# Patient Record
Sex: Male | Born: 1950 | Race: Asian | Hispanic: No | Marital: Married | Smoking: Never smoker
Health system: Southern US, Community
[De-identification: ages and names within clinical notes are randomized; demographics above are authoritative.]

## PROBLEM LIST (undated history)

## (undated) DIAGNOSIS — I10 Essential (primary) hypertension: Secondary | ICD-10-CM

## (undated) DIAGNOSIS — F32A Depression, unspecified: Secondary | ICD-10-CM

## (undated) DIAGNOSIS — F329 Major depressive disorder, single episode, unspecified: Secondary | ICD-10-CM

## (undated) DIAGNOSIS — E785 Hyperlipidemia, unspecified: Secondary | ICD-10-CM

## (undated) HISTORY — DX: Depression, unspecified: F32.A

## (undated) HISTORY — DX: Hyperlipidemia, unspecified: E78.5

## (undated) HISTORY — PX: HERNIA REPAIR: SHX51

## (undated) HISTORY — DX: Major depressive disorder, single episode, unspecified: F32.9

## (undated) HISTORY — DX: Essential (primary) hypertension: I10

---

## 1990-06-23 HISTORY — PX: HERNIA REPAIR: SHX51

## 2011-10-07 ENCOUNTER — Other Ambulatory Visit: Payer: Self-pay | Admitting: Internal Medicine

## 2011-11-22 ENCOUNTER — Ambulatory Visit (INDEPENDENT_AMBULATORY_CARE_PROVIDER_SITE_OTHER): Payer: BC Managed Care – PPO | Admitting: Emergency Medicine

## 2011-11-22 VITALS — BP 130/75 | HR 61 | Temp 98.0°F | Resp 16 | Ht 65.0 in | Wt 130.0 lb

## 2011-11-22 DIAGNOSIS — E785 Hyperlipidemia, unspecified: Secondary | ICD-10-CM

## 2011-11-22 DIAGNOSIS — I10 Essential (primary) hypertension: Secondary | ICD-10-CM

## 2011-11-22 LAB — POCT CBC
Granulocyte percent: 57 %G (ref 37–80)
MID (cbc): 0.3 (ref 0–0.9)
MPV: 9.4 fL (ref 0–99.8)
POC Granulocyte: 3.1 (ref 2–6.9)
POC MID %: 5.4 %M (ref 0–12)
Platelet Count, POC: 263 10*3/uL (ref 142–424)
RBC: 4.93 M/uL (ref 4.69–6.13)

## 2011-11-22 LAB — COMPREHENSIVE METABOLIC PANEL
ALT: 18 U/L (ref 0–53)
AST: 22 U/L (ref 0–37)
Albumin: 4.6 g/dL (ref 3.5–5.2)
Calcium: 9.4 mg/dL (ref 8.4–10.5)
Chloride: 102 mEq/L (ref 96–112)
Potassium: 4.9 mEq/L (ref 3.5–5.3)

## 2011-11-22 LAB — LIPID PANEL
HDL: 44 mg/dL (ref >39)
LDL Cholesterol: 98 mg/dL (ref 0–99)
VLDL: 29 mg/dL (ref 0–40)

## 2011-11-22 MED ORDER — PRAVASTATIN SODIUM 40 MG PO TABS: 40.0000 mg | ORAL_TABLET | Freq: Every day | ORAL | Status: DC

## 2011-11-22 MED ORDER — AMLODIPINE BESYLATE 5 MG PO TABS: 5.0000 mg | ORAL_TABLET | Freq: Every day | ORAL | Status: DC

## 2011-11-22 NOTE — Progress Notes (Signed)
  Subjective:    Patient ID: Patrick Abbott, male    DOB: 05/12/1951, 61 y.o.   MRN: [id number]  HPI patient enters for followup of his high cholesterol and high triglycerides. He has a history of hepatitis B. and has recovered with positive core antibody. He has a history of hemoglobin E. he states he's been feeling well. He does not smoke he occasionally drinks some beer he overall is in good health. He also currently is on pressure medications.    Review of Systems he denies chest pain shortness of breath GI problems.     Objective:   Physical Exam  Constitutional: He appears well-developed and well-nourished.  HENT:  Right Ear: External ear normal.  Left Ear: External ear normal.  Neck: No thyromegaly present.  Cardiovascular: Normal rate and regular rhythm.   Pulmonary/Chest: Effort normal and breath sounds normal.  Abdominal: Soft. He exhibits no distension. There is no tenderness. There is no rebound.    Results for orders placed in visit on 11/22/11  POCT CBC      Component Value Range   WBC 5.5  4.6 - 10.2 (K/uL)   Lymph, poc 2.1  0.6 - 3.4    POC LYMPH PERCENT 37.6  10 - 50 (%L)   MID (cbc) 0.3  0 - 0.9    POC MID % 5.4  0 - 12 (%M)   POC Granulocyte 3.1  2 - 6.9    Granulocyte percent 57.0  37 - 80 (%G)   RBC 4.93  4.69 - 6.13 (M/uL)   Hemoglobin 13.1 (*) 14.1 - 18.1 (g/dL)   HCT, POC 40.9 (*) 81.1 - 53.7 (%)   MCV 80.1  80 - 97 (fL)   MCH, POC 26.6 (*) 27 - 31.2 (pg)   MCHC 33.2  31.8 - 35.4 (g/dL)   RDW, POC 91.4     Platelet Count, POC 263  142 - 424 (K/uL)   MPV 9.4  0 - 99.8 (fL)        Assessment & Plan:  Patient in for followup of high cholesterol and his medications. Go ahead and check labs.

## 2012-11-29 ENCOUNTER — Other Ambulatory Visit: Payer: Self-pay | Admitting: Emergency Medicine

## 2012-12-23 ENCOUNTER — Ambulatory Visit (INDEPENDENT_AMBULATORY_CARE_PROVIDER_SITE_OTHER): Payer: BC Managed Care – PPO | Admitting: Family Medicine

## 2012-12-23 ENCOUNTER — Ambulatory Visit: Payer: BC Managed Care – PPO

## 2012-12-23 VITALS — BP 120/80 | HR 60 | Temp 97.7°F | Resp 18 | Ht 65.5 in | Wt 135.6 lb

## 2012-12-23 DIAGNOSIS — Z23 Encounter for immunization: Secondary | ICD-10-CM

## 2012-12-23 DIAGNOSIS — M546 Pain in thoracic spine: Secondary | ICD-10-CM

## 2012-12-23 DIAGNOSIS — E7800 Pure hypercholesterolemia, unspecified: Secondary | ICD-10-CM

## 2012-12-23 DIAGNOSIS — Z0000 Encounter for general adult medical examination without abnormal findings: Secondary | ICD-10-CM

## 2012-12-23 DIAGNOSIS — I10 Essential (primary) hypertension: Secondary | ICD-10-CM

## 2012-12-23 LAB — POCT URINALYSIS DIPSTICK
Leukocytes, UA: NEGATIVE
Nitrite, UA: NEGATIVE
Protein, UA: NEGATIVE
pH, UA: 5.5

## 2012-12-23 MED ORDER — AMLODIPINE BESYLATE 5 MG PO TABS: 5.0000 mg | ORAL_TABLET | Freq: Every day | ORAL | Status: DC

## 2012-12-23 MED ORDER — PRAVASTATIN SODIUM 40 MG PO TABS: 40.0000 mg | ORAL_TABLET | Freq: Every day | ORAL | Status: DC

## 2012-12-23 MED ORDER — MELOXICAM 15 MG PO TABS: 15.0000 mg | ORAL_TABLET | Freq: Every day | ORAL | Status: DC

## 2012-12-23 MED ORDER — CYCLOBENZAPRINE HCL 5 MG PO TABS: 5.0000 mg | ORAL_TABLET | Freq: Every evening | ORAL | Status: DC | PRN
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
## 2012-12-24 LAB — CBC WITH DIFFERENTIAL/PLATELET
Basophils Absolute: 0 10*3/uL (ref 0.0–0.1)
HCT: 38.5 % — ABNORMAL LOW (ref 39.0–52.0)
Lymphocytes Relative: 43 % (ref 12–46)
Monocytes Absolute: 0.4 10*3/uL (ref 0.1–1.0)
Neutro Abs: 3 10*3/uL (ref 1.7–7.7)
Neutrophils Relative %: 47 % (ref 43–77)
RDW: 14.2 % (ref 11.5–15.5)
WBC: 6.2 10*3/uL (ref 4.0–10.5)

## 2012-12-24 LAB — COMPREHENSIVE METABOLIC PANEL
Albumin: 4.7 g/dL (ref 3.5–5.2)
CO2: 28 mEq/L (ref 19–32)
Calcium: 9.1 mg/dL (ref 8.4–10.5)
Glucose, Bld: 82 mg/dL (ref 70–99)
Sodium: 139 mEq/L (ref 135–145)
Total Bilirubin: 0.5 mg/dL (ref 0.3–1.2)
Total Protein: 7.6 g/dL (ref 6.0–8.3)

## 2012-12-24 LAB — LIPID PANEL: HDL: 44 mg/dL (ref >39)

## 2012-12-24 LAB — HEMOGLOBIN A1C
Hgb A1c MFr Bld: 5.1 % (ref <5.7)
Mean Plasma Glucose: 100 mg/dL (ref <117)

## 2012-12-24 LAB — VITAMIN D 25 HYDROXY (VIT D DEFICIENCY, FRACTURES): Vit D, 25-Hydroxy: 23 ng/mL — ABNORMAL LOW (ref 30–89)

## 2012-12-24 LAB — VITAMIN B12: Vitamin B-12: 413 pg/mL (ref 211–911)

## 2012-12-24 LAB — TSH: TSH: 0.706 u[IU]/mL (ref 0.350–4.500)

## 2012-12-25 LAB — PSA: PSA: 0.74 ng/mL (ref <=4.00)

## 2013-07-04 ENCOUNTER — Other Ambulatory Visit: Payer: Self-pay | Admitting: Family Medicine

## 2013-07-29 ENCOUNTER — Ambulatory Visit (INDEPENDENT_AMBULATORY_CARE_PROVIDER_SITE_OTHER): Payer: BC Managed Care – PPO | Admitting: Emergency Medicine

## 2013-07-29 VITALS — BP 130/88 | HR 55 | Temp 98.2°F | Resp 16 | Ht 65.5 in | Wt 136.0 lb

## 2013-07-29 DIAGNOSIS — B029 Zoster without complications: Secondary | ICD-10-CM

## 2013-07-29 MED ORDER — ACETAMINOPHEN-CODEINE #3 300-30 MG PO TABS: 1.0000 | ORAL_TABLET | ORAL | Status: DC | PRN

## 2013-07-29 MED ORDER — PRAVASTATIN SODIUM 40 MG PO TABS: 40.0000 mg | ORAL_TABLET | Freq: Every day | ORAL | Status: DC

## 2013-07-29 MED ORDER — AMLODIPINE BESYLATE 5 MG PO TABS: 5.0000 mg | ORAL_TABLET | Freq: Every day | ORAL | Status: DC

## 2013-07-29 NOTE — Progress Notes (Signed)
[facility] [address], [place] [address] [phone]  Date:  07/29/2013   Name:  Yechezkel Fertig   DOB:  [birth year]   MRN:  [id number]  PCP:  No PCP Per Patient    Chief Complaint: Rash and Medication Refill   History of Present Illness:  Crystal Ellwood is a 63 y.o. very pleasant male patient who presents with the following:  History of varicella.  Now has painful vesicular rash on his left side from the midline around to the lateral lower chest wall.  No fever or chills.  Says started Monday. No improvement with over the counter medications or other home remedies. Denies other complaint or health concern today.   There are no active problems to display for this patient.   Past Medical History  Diagnosis Date  . Hyperlipidemia   . Hypertension     Past Surgical History  Procedure Laterality Date  . Hernia repair      Left    History  Substance Use Topics  . Smoking status: Never Smoker   . Smokeless tobacco: Not on file  . Alcohol Use: No    No family history on file.  No Known Allergies  Medication list has been reviewed and updated.  Current Outpatient Prescriptions on File Prior to Visit  Medication Sig Dispense Refill  . amLODipine (NORVASC) 5 MG tablet Take 1 tablet (5 mg total) by mouth daily. PATIENT NEEDS OFFICE VISIT FOR ADDITIONAL REFILLS  30 tablet  0  . pravastatin (PRAVACHOL) 40 MG tablet Take 1 tablet (40 mg total) by mouth daily. PATIENT NEEDS OFFICE VISIT FOR ADDITIONAL REFILLS  30 tablet  0  . cyclobenzaprine (FLEXERIL) 5 MG tablet Take 1 tablet (5 mg total) by mouth at bedtime as needed for muscle spasms. And back pain  30 tablet  0  . meloxicam (MOBIC) 15 MG tablet Take 1 tablet (15 mg total) by mouth daily. For back pain  30 tablet  0   No current facility-administered medications on file prior to visit.    Review of Systems:  As per HPI, otherwise negative.    Physical Examination: Filed Vitals:   07/29/13 1753   BP: 130/88  Pulse: 55  Temp: 98.2 F (36.8 C)  Resp: 16   Filed Vitals:   07/29/13 1753  Height: 5' 5.5" (1.664 m)  Weight: 136 lb (61.689 kg)   Body mass index is 22.28 kg/(m^2). Ideal Body Weight: Weight in (lb) to have BMI = 25: 152.2   GEN: WDWN, NAD, Non-toxic, Alert & Oriented x 3 HEENT: Atraumatic, Normocephalic.  Ears and Nose: No external deformity. EXTR: No clubbing/cyanosis/edema NEURO: Normal gait.  PSYCH: Normally interactive. Conversant. Not depressed or anxious appearing.  Calm demeanor.  SKIN:  Shingles left chest wall  Assessment and Plan: Shingles Tylenol #3 Too late for valtrex   Signed,  Ellison Carwin, MD

## 2013-07-29 NOTE — Addendum Note (Signed)
Addended by: Roselee Culver on: 07/29/2013 06:53 PM   Modules accepted: Orders

## 2013-07-29 NOTE — Patient Instructions (Signed)
Shingles Shingles (herpes zoster) is an infection that is caused by the same virus that causes chickenpox (varicella). The infection causes a painful skin rash and fluid-filled blisters, which eventually break open, crust over, and heal. It may occur in any area of the body, but it usually affects only one side of the body or face. The pain of shingles usually lasts about 1 month. However, some people with shingles may develop long-term (chronic) pain in the affected area of the body. Shingles often occurs many years after the person had chickenpox. It is more common:  In people older than 50 years.  In people with weakened immune systems, such as those with HIV, AIDS, or cancer.  In people taking medicines that weaken the immune system, such as transplant medicines.  In people under great stress. CAUSES  Shingles is caused by the varicella zoster virus (VZV), which also causes chickenpox. After a person is infected with the virus, it can remain in the person's body for years in an inactive state (dormant). To cause shingles, the virus reactivates and breaks out as an infection in a nerve root. The virus can be spread from person to person (contagious) through contact with open blisters of the shingles rash. It will only spread to people who have not had chickenpox. When these people are exposed to the virus, they may develop chickenpox. They will not develop shingles. Once the blisters scab over, the person is no longer contagious and cannot spread the virus to others. SYMPTOMS  Shingles shows up in stages. The initial symptoms may be pain, itching, and tingling in an area of the skin. This pain is usually described as burning, stabbing, or throbbing.In a few days or weeks, a painful red rash will appear in the area where the pain, itching, and tingling were felt. The rash is usually on one side of the body in a band or belt-like pattern. Then, the rash usually turns into fluid-filled blisters. They  will scab over and dry up in approximately 2 3 weeks. Flu-like symptoms may also occur with the initial symptoms, the rash, or the blisters. These may include:  Fever.  Chills.  Headache.  Upset stomach. DIAGNOSIS  Your caregiver will perform a skin exam to diagnose shingles. Skin scrapings or fluid samples may also be taken from the blisters. This sample will be examined under a microscope or sent to a lab for further testing. TREATMENT  There is no specific cure for shingles. Your caregiver will likely prescribe medicines to help you manage the pain, recover faster, and avoid long-term problems. This may include antiviral drugs, anti-inflammatory drugs, and pain medicines. HOME CARE INSTRUCTIONS   Take a cool bath or apply cool compresses to the area of the rash or blisters as directed. This may help with the pain and itching.   Only take over-the-counter or prescription medicines as directed by your caregiver.   Rest as directed by your caregiver.  Keep your rash and blisters clean with mild soap and cool water or as directed by your caregiver.  Do not pick your blisters or scratch your rash. Apply an anti-itch cream or numbing creams to the affected area as directed by your caregiver.  Keep your shingles rash covered with a loose bandage (dressing).  Avoid skin contact with:  Babies.   Pregnant women.   Children with eczema.   Elderly people with transplants.   People with chronic illnesses, such as leukemia or AIDS.   Wear loose-fitting clothing to help ease   the pain of material rubbing against the rash.  Keep all follow-up appointments with your caregiver.If the area involved is on your face, you may receive a referral for follow-up to a specialist, such as an eye doctor (ophthalmologist) or an ear, nose, and throat (ENT) doctor. Keeping all follow-up appointments will help you avoid eye complications, chronic pain, or disability.  SEEK IMMEDIATE MEDICAL  CARE IF:   You have facial pain, pain around the eye area, or loss of feeling on one side of your face.  You have ear pain or ringing in your ear.  You have loss of taste.  Your pain is not relieved with prescribed medicines.   Your redness or swelling spreads.   You have more pain and swelling.  Your condition is worsening or has changed.   You have a feveror persistent symptoms for more than 2 3 days.  You have a fever and your symptoms suddenly get worse. MAKE SURE YOU:  Understand these instructions.  Will watch your condition.  Will get help right away if you are not doing well or get worse. Document Released: 06/09/2005 Document Revised: 03/03/2012 Document Reviewed: 01/22/2012 ExitCare Patient Information 2014 [employer].  

## 2014-02-18 ENCOUNTER — Ambulatory Visit (INDEPENDENT_AMBULATORY_CARE_PROVIDER_SITE_OTHER): Payer: BC Managed Care – PPO | Admitting: Family Medicine

## 2014-02-18 VITALS — BP 116/72 | HR 61 | Temp 97.7°F | Resp 14 | Ht 64.5 in | Wt 131.2 lb

## 2014-02-18 DIAGNOSIS — G5600 Carpal tunnel syndrome, unspecified upper limb: Secondary | ICD-10-CM | POA: Insufficient documentation

## 2014-02-18 DIAGNOSIS — G5601 Carpal tunnel syndrome, right upper limb: Secondary | ICD-10-CM

## 2014-02-18 MED ORDER — MELOXICAM 15 MG PO TABS: 15.0000 mg | ORAL_TABLET | Freq: Every day | ORAL | Status: DC

## 2014-02-18 NOTE — Patient Instructions (Signed)
Carpal Tunnel Syndrome The carpal tunnel is a narrow area located on the palm side of your wrist. The tunnel is formed by the wrist bones and ligaments. Nerves, blood vessels, and tendons pass through the carpal tunnel. Repeated wrist motion or certain diseases may cause swelling within the tunnel. This swelling pinches the main nerve in the wrist (median nerve) and causes the painful hand and arm condition called carpal tunnel syndrome. CAUSES   Repeated wrist motions.  Wrist injuries.  Certain diseases like arthritis, diabetes, alcoholism, hyperthyroidism, and kidney failure.  Obesity.  Pregnancy. SYMPTOMS   A "pins and needles" feeling in your fingers or hand, especially in your thumb, index and middle fingers.  Tingling or numbness in your fingers or hand.  An aching feeling in your entire arm, especially when your wrist and elbow are bent for long periods of time.  Wrist pain that goes up your arm to your shoulder.  Pain that goes down into your palm or fingers.  A weak feeling in your hands. DIAGNOSIS  Your health care provider will take your history and perform a physical exam. An electromyography test may be needed. This test measures electrical signals sent out by your nerves into the muscles. The electrical signals are usually slowed by carpal tunnel syndrome. You may also need X-rays. TREATMENT  Carpal tunnel syndrome may clear up by itself. Your health care provider may recommend a wrist splint or medicine such as a nonsteroidal anti-inflammatory medicine. Cortisone injections may help. Sometimes, surgery may be needed to free the pinched nerve.  HOME CARE INSTRUCTIONS   Take all medicine as directed by your health care provider. Only take over-the-counter or prescription medicines for pain, discomfort, or fever as directed by your health care provider.  If you were given a splint to keep your wrist from bending, wear it as directed. It is important to wear the splint at  night. Wear the splint for as long as you have pain or numbness in your hand, arm, or wrist. This may take 1 to 2 months.  Rest your wrist from any activity that may be causing your pain. If your symptoms are work-related, you may need to talk to your employer about changing to a job that does not require using your wrist.  Put ice on your wrist after long periods of wrist activity.  Put ice in a plastic bag.  Place a towel between your skin and the bag.  Leave the ice on for 15-20 minutes, 03-04 times a day.  Keep all follow-up visits as directed by your health care provider. This includes any orthopedic referrals, physical therapy, and rehabilitation. Any delay in getting necessary care could result in a delay or failure of your condition to heal. SEEK IMMEDIATE MEDICAL CARE IF:   You have new, unexplained symptoms.  Your symptoms get worse and are not helped or controlled with medicines. MAKE SURE YOU:   Understand these instructions.  Will watch your condition.  Will get help right away if you are not doing well or get worse. Document Released: 06/06/2000 Document Revised: 10/24/2013 Document Reviewed: 04/25/2011 ExitCare Patient Information 2015 [employer]. This information is not intended to replace advice given to you by your health care provider. Make sure you discuss any questions you have with your health care provider.  

## 2014-02-18 NOTE — Progress Notes (Signed)
° °  Subjective:    Patient ID: Patrick Abbott, male    DOB: Sep 06, 1950, 63 y.o.   MRN: [id number]  HPI Chief Complaint  Patient presents with   Hand Pain     right arm numbness up to arm, mainly in fingers   Follow-up    previously had shingles inquiring about the shot    This chart was scribed for Patrick Abbott , MD by Thea Alken, ED Scribe. This patient was seen in room 5 and the patient's care was started at 11:10 AM.  HPI Comments: Fields Oros is a 63 y.o. male who [facility] complaining of right wrist and hand numbness. Pt reports numbness to finger, hands and entire right arm. 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 He reports pain is relieved with relaxation.  Pt reports denies pain with movement. Pt denies pain with movement.    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  He does the floors and is physically active  Right handed.  Past Medical History  Diagnosis Date   Hyperlipidemia    Hypertension    Past Surgical History  Procedure Laterality Date   Hernia repair      Left   Prior to Admission medications   Medication Sig Start Date End Date Taking? Authorizing Provider  acetaminophen-codeine (TYLENOL #3) 300-30 MG per tablet Take 1-2 tablets by mouth every 4 (four) hours as needed. 07/29/13  Yes Roselee Culver, MD  amLODipine (NORVASC) 5 MG tablet Take 1 tablet (5 mg total) by mouth daily. 07/29/13  Yes Roselee Culver, MD  aspirin 325 MG tablet Take 325 mg by mouth daily.   Yes Historical Provider, MD  pravastatin (PRAVACHOL) 40 MG tablet Take 1 tablet (40 mg total) by mouth daily. 07/29/13  Yes Roselee Culver, MD  VITAMIN D, CHOLECALCIFEROL, PO Take 1 tablet by mouth daily.   Yes Historical Provider, MD  cyclobenzaprine (FLEXERIL) 5 MG tablet Take 1 tablet (5 mg total) by mouth at bedtime as needed for muscle spasms. And back pain 12/23/12   Wardell Honour, MD  meloxicam (MOBIC) 15 MG tablet Take  1 tablet (15 mg total) by mouth daily. For back pain 12/23/12   Wardell Honour, MD   Review of Systems  Objective:   Physical Exam  Nursing note and vitals reviewed. Constitutional: He is oriented to person, place, and time. He appears well-developed and well-nourished. No distress.  HENT:  Head: Normocephalic and atraumatic.  Eyes: Conjunctivae and EOM are normal.  Neck: Neck supple.  Cardiovascular: Normal rate.   Pulmonary/Chest: Effort normal.  Musculoskeletal: Normal range of motion.  Neurological: He is alert and oriented to person, place, and time.  Skin: Skin is warm and dry.  Psychiatric: He has a normal mood and affect. His behavior is normal.   full range of motion right hand. No muscle wasting in the hand. Negative Tinel's sign. Neck negative Phalen's sign. Assessment & Plan:   Carpal tunnel syndrome of right wrist - Plan: meloxicam (MOBIC) 15 MG tablet  Signed, Patrick Haber, MD

## 2014-07-28 ENCOUNTER — Other Ambulatory Visit: Payer: Self-pay | Admitting: Emergency Medicine

## 2014-09-10 ENCOUNTER — Other Ambulatory Visit: Payer: Self-pay | Admitting: Physician Assistant

## 2014-09-16 ENCOUNTER — Ambulatory Visit (INDEPENDENT_AMBULATORY_CARE_PROVIDER_SITE_OTHER): Payer: BLUE CROSS/BLUE SHIELD | Admitting: Physician Assistant

## 2014-09-16 VITALS — BP 118/72 | HR 69 | Temp 97.5°F | Resp 18 | Ht 65.0 in | Wt 139.0 lb

## 2014-09-16 DIAGNOSIS — E7800 Pure hypercholesterolemia, unspecified: Secondary | ICD-10-CM

## 2014-09-16 DIAGNOSIS — I10 Essential (primary) hypertension: Secondary | ICD-10-CM | POA: Insufficient documentation

## 2014-09-16 DIAGNOSIS — Z23 Encounter for immunization: Secondary | ICD-10-CM | POA: Diagnosis not present

## 2014-09-16 DIAGNOSIS — E785 Hyperlipidemia, unspecified: Secondary | ICD-10-CM | POA: Insufficient documentation

## 2014-09-16 LAB — COMPLETE METABOLIC PANEL WITH GFR
ALT: 24 U/L (ref 0–53)
AST: 23 U/L (ref 0–37)
Albumin: 4.1 g/dL (ref 3.5–5.2)
Alkaline Phosphatase: 60 U/L (ref 39–117)
BUN: 13 mg/dL (ref 6–23)
CO2: 23 mEq/L (ref 19–32)
Calcium: 8.7 mg/dL (ref 8.4–10.5)
Chloride: 103 mEq/L (ref 96–112)
Creat: 0.94 mg/dL (ref 0.50–1.35)
GFR, Est African American: >89 mL/min
GFR, Est Non African American: 85 mL/min
Glucose, Bld: 85 mg/dL (ref 70–99)
Potassium: 4.2 mEq/L (ref 3.5–5.3)
Sodium: 138 mEq/L (ref 135–145)
TOTAL PROTEIN: 7.2 g/dL (ref 6.0–8.3)
Total Bilirubin: 0.6 mg/dL (ref 0.2–1.2)

## 2014-09-16 LAB — LIPID PANEL
CHOL/HDL RATIO: 4.1 ratio
Cholesterol: 174 mg/dL (ref 0–200)
HDL: 42 mg/dL (ref >=40)
LDL Cholesterol: 110 mg/dL — ABNORMAL HIGH (ref 0–99)
TRIGLYCERIDES: 108 mg/dL (ref <150)
VLDL: 22 mg/dL (ref 0–40)

## 2014-09-16 MED ORDER — ZOSTER VACCINE LIVE 19400 UNT/0.65ML ~~LOC~~ SOLR: 0.6500 mL | Freq: Once | SUBCUTANEOUS | Status: DC

## 2014-09-16 MED ORDER — PRAVASTATIN SODIUM 40 MG PO TABS
ORAL_TABLET | ORAL | Status: DC
Start: 2014-09-16 — End: 2015-03-19

## 2014-09-16 MED ORDER — AMLODIPINE BESYLATE 5 MG PO TABS: 5.0000 mg | ORAL_TABLET | Freq: Every day | ORAL | Status: DC

## 2014-09-16 NOTE — Progress Notes (Signed)
   Subjective:    Patient ID: Patrick Abbott, male    DOB: 01/05/1951, 64 y.o.   MRN: [id number]  HPI Pt presents to clinic with for medication refill.  He is tolerating his medication ok.  He does not check his BP at home.  He is having no CP or SOB.  He would also like to get his Shingles vaccination.   Review of Systems  Respiratory: Negative for cough.   Cardiovascular: Negative for chest pain and leg swelling.   Patient Active Problem List   Diagnosis Date Noted  . Benign essential HTN 09/16/2014  . Pure hypercholesterolemia 09/16/2014  . CTS (carpal tunnel syndrome) 02/18/2014   Prior to Admission medications   Medication Sig Start Date End Date Taking? Authorizing Provider  amLODipine (NORVASC) 5 MG tablet Take 1 tablet (5 mg total) by mouth daily. NO MORE REFILLS WITHOUT OFFICE VISIT - 2ND NOTICE 09/16/14  Yes Mancel Bale, PA-C  aspirin 325 MG tablet Take 325 mg by mouth daily.   Yes Historical Provider, MD  pravastatin (PRAVACHOL) 40 MG tablet TAKE 1 TABLET BY MOUTH EVERY DAY. 09/16/14  Yes Mancel Bale, PA-C  VITAMIN D, CHOLECALCIFEROL, PO Take 1 tablet by mouth daily.   Yes Historical Provider, MD       Mancel Bale, PA-C   No Known Allergies  Medications, allergies, past medical history, surgical history, family history, social history and problem list reviewed and updated.      Objective:   Physical Exam  Constitutional: He is oriented to person, place, and time. He appears well-developed and well-nourished.  BP 118/72 mmHg  Pulse 69  Temp(Src) 97.5 F (36.4 C) (Oral)  Resp 18  Ht 5\' 5"  (1.651 m)  Wt 139 lb (63.05 kg)  BMI 23.13 kg/m2  SpO2 99%   HENT:  Head: Normocephalic and atraumatic.  Right Ear: External ear normal.  Left Ear: External ear normal.  Eyes: Conjunctivae are normal.  Neck: Normal range of motion.  Cardiovascular: Normal rate, regular rhythm and normal heart sounds.   No murmur heard. Pulmonary/Chest: Effort normal and breath sounds  normal. He has no wheezes.  Musculoskeletal:  No LE edema  Neurological: He is alert and oriented to person, place, and time.  Skin: Skin is warm and dry.  Psychiatric: He has a normal mood and affect. His behavior is normal. Judgment and thought content normal.       Assessment & Plan:  Benign essential HTN - Plan: COMPLETE METABOLIC PANEL WITH GFR, amLODipine (NORVASC) 5 MG tablet  Pure hypercholesterolemia - Plan: COMPLETE METABOLIC PANEL WITH GFR, Lipid panel, pravastatin (PRAVACHOL) 40 MG tablet  Need for shingles vaccine - Plan: zoster vaccine live, PF, (ZOSTAVAX) 72620 UNT/0.65ML injection  Windell Hummingbird PA-C  [facility] 09/16/2014 9:33 AM

## 2014-09-16 NOTE — Patient Instructions (Signed)
I will contact you with your lab results as soon as they are available.   If you have not heard from me in 2 weeks, please contact me.  The fastest way to get your results is to register for My Chart (see the instructions on the last page of this printout).   

## 2014-09-17 ENCOUNTER — Encounter: Payer: Self-pay | Admitting: Physician Assistant

## 2015-03-19 ENCOUNTER — Other Ambulatory Visit: Payer: Self-pay | Admitting: Physician Assistant

## 2015-04-10 ENCOUNTER — Ambulatory Visit (INDEPENDENT_AMBULATORY_CARE_PROVIDER_SITE_OTHER): Payer: BLUE CROSS/BLUE SHIELD | Admitting: Physician Assistant

## 2015-04-10 ENCOUNTER — Encounter: Payer: Self-pay | Admitting: Physician Assistant

## 2015-04-10 VITALS — BP 134/75 | HR 73 | Temp 98.1°F | Resp 16 | Wt 131.2 lb

## 2015-04-10 DIAGNOSIS — E7800 Pure hypercholesterolemia, unspecified: Secondary | ICD-10-CM

## 2015-04-10 DIAGNOSIS — Z23 Encounter for immunization: Secondary | ICD-10-CM | POA: Diagnosis not present

## 2015-04-10 DIAGNOSIS — I10 Essential (primary) hypertension: Secondary | ICD-10-CM

## 2015-04-10 LAB — COMPLETE METABOLIC PANEL WITH GFR
ALT: 18 U/L (ref 9–46)
AST: 18 U/L (ref 10–35)
Albumin: 4.1 g/dL (ref 3.6–5.1)
Alkaline Phosphatase: 56 U/L (ref 40–115)
BUN: 12 mg/dL (ref 7–25)
CHLORIDE: 102 mmol/L (ref 98–110)
CO2: 30 mmol/L (ref 20–31)
Calcium: 9 mg/dL (ref 8.6–10.3)
Creat: 0.95 mg/dL (ref 0.70–1.25)
GFR, Est African American: >89 mL/min (ref >=60)
GFR, Est Non African American: 84 mL/min (ref >=60)
Glucose, Bld: 100 mg/dL — ABNORMAL HIGH (ref 65–99)
POTASSIUM: 4.6 mmol/L (ref 3.5–5.3)
SODIUM: 138 mmol/L (ref 135–146)
Total Bilirubin: 0.6 mg/dL (ref 0.2–1.2)
Total Protein: 7.3 g/dL (ref 6.1–8.1)

## 2015-04-10 LAB — LIPID PANEL
CHOL/HDL RATIO: 4.3 ratio (ref <=5.0)
Cholesterol: 191 mg/dL (ref 125–200)
HDL: 44 mg/dL (ref >=40)
LDL Cholesterol: 98 mg/dL (ref <130)
Triglycerides: 245 mg/dL — ABNORMAL HIGH (ref <150)
VLDL: 49 mg/dL — AB (ref <30)

## 2015-04-10 MED ORDER — PRAVASTATIN SODIUM 40 MG PO TABS: 40.0000 mg | ORAL_TABLET | Freq: Every day | ORAL | Status: DC

## 2015-04-10 MED ORDER — AMLODIPINE BESYLATE 5 MG PO TABS: 5.0000 mg | ORAL_TABLET | Freq: Every day | ORAL | Status: DC

## 2015-04-10 NOTE — Patient Instructions (Signed)
2 natural things to help with your cholesterol Fish oil Red yeast rice  High Cholesterol High cholesterol refers to having a high level of cholesterol in your blood. Cholesterol is a white, waxy, fat-like protein that your body needs in small amounts. Your liver makes all the cholesterol you need. Excess cholesterol comes from the food you eat. Cholesterol travels in your bloodstream through your blood vessels. If you have high cholesterol, deposits (plaque) may build up on the walls of your blood vessels. This makes the arteries narrower and stiffer. Plaque increases your risk of heart attack and stroke. Work with your health care provider to keep your cholesterol levels in a healthy range. RISK FACTORS Several things can make you more likely to have high cholesterol. These include:   Eating foods high in animal fat (saturated fat) or cholesterol.  Being overweight.  Not getting enough exercise.  Having a family history of high cholesterol. SIGNS AND SYMPTOMS High cholesterol does not cause symptoms. DIAGNOSIS  Your health care provider can do a blood test to check whether you have high cholesterol. If you are older than 20, your health care provider may check your cholesterol every 4-6 years. You may be checked more often if you already have high cholesterol or other risk factors for heart disease. The blood test for cholesterol measures the following:  Bad cholesterol (LDL cholesterol). This is the type of cholesterol that causes heart disease. This number should be less than 100.  Good cholesterol (HDL cholesterol). This type helps protect against heart disease. A healthy level of HDL cholesterol is 60 or higher.  Total cholesterol. This is the combined number of LDL cholesterol and HDL cholesterol. A healthy number is less than 200. TREATMENT  High cholesterol can be treated with diet changes, lifestyle changes, and medicine.   Diet changes may include eating more whole grains,  fruits, vegetables, nuts, and fish. You may also have to cut back on red meat and foods with a lot of added sugar.  Lifestyle changes may include getting at least 40 minutes of aerobic exercise three times a week. Aerobic exercises include walking, biking, and swimming. Aerobic exercise along with a healthy diet can help you maintain a healthy weight. Lifestyle changes may also include quitting smoking.  If diet and lifestyle changes are not enough to lower your cholesterol, your health care provider may prescribe a statin medicine. This medicine has been shown to lower cholesterol and also lower the risk of heart disease. HOME CARE INSTRUCTIONS  Only take over-the-counter or prescription medicines as directed by your health care provider.   Follow a healthy diet as directed by your health care provider. For instance:   Eat chicken (without skin), fish, veal, shellfish, ground [place] breast, and round or loin cuts of red meat.  Do not eat fried foods and fatty meats, such as hot dogs and salami.   Eat plenty of fruits, such as apples.   Eat plenty of vegetables, such as broccoli, potatoes, and carrots.   Eat beans, peas, and lentils.   Eat grains, such as barley, rice, couscous, and bulgur wheat.   Eat pasta without cream sauces.   Use skim or nonfat milk and low-fat or nonfat yogurt and cheeses. Do not eat or drink whole milk, cream, ice cream, egg yolks, and hard cheeses.   Do not eat stick margarine or tub margarines that contain trans fats (also called partially hydrogenated oils).   Do not eat cakes, cookies, crackers, or other baked goods  that contain trans fats.   Do not eat saturated tropical oils, such as coconut and palm oil.   Exercise as directed by your health care provider. Increase your activity level with activities such as gardening or walking.   Keep all follow-up appointments.  SEEK MEDICAL CARE IF:  You are struggling to maintain a healthy diet  or weight.  You need help starting an exercise program.  You need help to stop smoking. SEEK IMMEDIATE MEDICAL CARE IF:  You have chest pain.  You have trouble breathing.   This information is not intended to replace advice given to you by your health care provider. Make sure you discuss any questions you have with your health care provider.   Document Released: 06/09/2005 Document Revised: 06/30/2014 Document Reviewed: 04/01/2013 Elsevier Interactive Patient Education [employer].

## 2015-04-10 NOTE — Progress Notes (Signed)
   Patrick Abbott  MRN: [id number] DOB: [birth year]  Subjective:  Pt presents to clinic for a recheck.  He is doing really well.  Taking his medication without problems.  He is wondering if he can ever get off of his medication.  Several weeks ago he had an episode of chest pain that only lasted a few hours - he thinks it was like his heartburn in the past.  He did not have any associated symptoms like sweating or nausea - he did not have increased pain with activity and he does not remember when it started.  He has not had anything type of chest pain since that incident.  He has an active job and has not noticed any problems with SOB or chest pain with activity.  Patient Active Problem List   Diagnosis Date Noted  . Benign essential HTN 09/16/2014  . Pure hypercholesterolemia 09/16/2014  . CTS (carpal tunnel syndrome) 02/18/2014    Current Outpatient Prescriptions on File Prior to Visit  Medication Sig Dispense Refill  . aspirin 325 MG tablet Take 325 mg by mouth daily.    Marland Kitchen VITAMIN D, CHOLECALCIFEROL, PO Take 1 tablet by mouth daily.    Marland Kitchen zoster vaccine live, PF, (ZOSTAVAX) 65784 UNT/0.65ML injection Inject 19,400 Units into the skin once. 1 each 0   No current facility-administered medications on file prior to visit.    Not on File  Review of Systems  Respiratory: Negative for cough and shortness of breath.   Cardiovascular: Positive for chest pain. Negative for palpitations and leg swelling.   Objective:  BP 134/75 mmHg  Pulse 73  Temp(Src) 98.1 F (36.7 C) (Oral)  Resp 16  Wt 131 lb 3.2 oz (59.512 kg)  Physical Exam  Constitutional: He is oriented to person, place, and time and well-developed, well-nourished, and in no distress.  HENT:  Head: Normocephalic and atraumatic.  Right Ear: External ear normal.  Left Ear: External ear normal.  Eyes: Conjunctivae are normal.  Neck: Normal range of motion.  Cardiovascular: Normal rate, regular rhythm, normal heart sounds and intact  distal pulses.   Pulmonary/Chest: Effort normal and breath sounds normal. He has no wheezes.  Musculoskeletal:       Right lower leg: He exhibits no edema.       Left lower leg: He exhibits no edema.  Neurological: He is alert and oriented to person, place, and time. Gait normal.  Skin: Skin is warm and dry.  Psychiatric: Mood, memory, affect and judgment normal.    Assessment and Plan :  Need for prophylactic vaccination and inoculation against influenza - Plan: Flu Vaccine QUAD 36+ mos IM  Benign essential HTN - Plan: amLODipine (NORVASC) 5 MG tablet, COMPLETE METABOLIC PANEL WITH GFR - good HTN control - we discussed things that he can do at home but he already does not eat a lot of salt.  Pure hypercholesterolemia - Plan: pravastatin (PRAVACHOL) 40 MG tablet, Lipid panel - check labs - we discussed diet changes that he could make to help him get off this medication - we also discuss fish oil and red yeast rice as supplements that might allow him to get off of his Rx medications.  Windell Hummingbird PA-C  [facility] 04/10/2015 9:38 AM

## 2015-04-12 ENCOUNTER — Encounter: Payer: Self-pay | Admitting: Physician Assistant

## 2015-04-16 ENCOUNTER — Other Ambulatory Visit: Payer: Self-pay | Admitting: Physician Assistant

## 2015-05-13 ENCOUNTER — Telehealth: Payer: Self-pay | Admitting: Family Medicine

## 2015-05-13 NOTE — Telephone Encounter (Signed)
Tried three times phone is either off or busy sent letter of appt being changed from [place] 10/10/15 to Thursday letter sent out on 05/14/15 Monday

## 2015-08-09 ENCOUNTER — Ambulatory Visit (INDEPENDENT_AMBULATORY_CARE_PROVIDER_SITE_OTHER): Payer: BLUE CROSS/BLUE SHIELD | Admitting: Physician Assistant

## 2015-08-09 ENCOUNTER — Encounter: Payer: Self-pay | Admitting: Physician Assistant

## 2015-08-09 VITALS — BP 124/72 | HR 83 | Temp 99.0°F | Resp 16 | Wt 140.0 lb

## 2015-08-09 DIAGNOSIS — I10 Essential (primary) hypertension: Secondary | ICD-10-CM | POA: Diagnosis not present

## 2015-08-09 DIAGNOSIS — E7800 Pure hypercholesterolemia, unspecified: Secondary | ICD-10-CM | POA: Diagnosis not present

## 2015-08-09 DIAGNOSIS — R300 Dysuria: Secondary | ICD-10-CM

## 2015-08-09 DIAGNOSIS — R3989 Other symptoms and signs involving the genitourinary system: Secondary | ICD-10-CM | POA: Diagnosis not present

## 2015-08-09 DIAGNOSIS — R319 Hematuria, unspecified: Secondary | ICD-10-CM | POA: Diagnosis not present

## 2015-08-09 LAB — POCT URINALYSIS DIP (MANUAL ENTRY)
BILIRUBIN UA: NEGATIVE
Glucose, UA: NEGATIVE
Ketones, POC UA: NEGATIVE
LEUKOCYTES UA: NEGATIVE
NITRITE UA: NEGATIVE
PH UA: 6.5
PROTEIN UA: NEGATIVE
Spec Grav, UA: 1.01
Urobilinogen, UA: 0.2

## 2015-08-09 LAB — POC MICROSCOPIC URINALYSIS (UMFC): MUCUS RE: ABSENT

## 2015-08-09 MED ORDER — CIPROFLOXACIN HCL 500 MG PO TABS: 500.0000 mg | ORAL_TABLET | Freq: Two times a day (BID) | ORAL | Status: DC

## 2015-08-09 MED ORDER — AMLODIPINE BESYLATE 5 MG PO TABS: 5.0000 mg | ORAL_TABLET | Freq: Every day | ORAL | Status: DC

## 2015-08-09 NOTE — Progress Notes (Signed)
Patrick Abbott  MRN: [id number] DOB: 1950-09-01  Subjective:  Pt presents to clinic with burning with urination at the tip of his penis throughout the entire urination.  He is also having some pressure and sensation that he has to urinate all the time and though he is able to urinate he does not feel like it is very much compared to the sensation.  He has not noticed a change in his flow.  He is not sexually active with a new partner.  It is also almost time for his HTN recheck and we will do that today for him also.  Patient Active Problem List   Diagnosis Date Noted  . Benign essential HTN 09/16/2014  . Pure hypercholesterolemia 09/16/2014  . CTS (carpal tunnel syndrome) 02/18/2014    Current Outpatient Prescriptions on File Prior to Visit  Medication Sig Dispense Refill  . aspirin 325 MG tablet Take 325 mg by mouth daily.    . pravastatin (PRAVACHOL) 40 MG tablet Take 1 tablet (40 mg total) by mouth daily. 90 tablet 1  . VITAMIN D, CHOLECALCIFEROL, PO Take 1 tablet by mouth daily.    Marland Kitchen zoster vaccine live, PF, (ZOSTAVAX) 91478 UNT/0.65ML injection Inject 19,400 Units into the skin once. 1 each 0   No current facility-administered medications on file prior to visit.    No Known Allergies  Review of Systems  Respiratory: Negative for cough and shortness of breath.   Cardiovascular: Negative for chest pain, palpitations and leg swelling.  Genitourinary: Positive for dysuria and urgency. Negative for frequency.   Objective:  BP 124/72 mmHg  Pulse 83  Temp(Src) 99 F (37.2 C) (Oral)  Resp 16  Wt 140 lb (63.504 kg)  Physical Exam  Constitutional: He is oriented to person, place, and time and well-developed, well-nourished, and in no distress.  HENT:  Head: Normocephalic and atraumatic.  Right Ear: External ear normal.  Left Ear: External ear normal.  Eyes: Conjunctivae are normal.  Neck: Normal range of motion.  Cardiovascular: Normal rate, regular rhythm, normal heart sounds  and intact distal pulses.   Pulmonary/Chest: Effort normal and breath sounds normal. He has no wheezes.  Genitourinary: Penis normal. Prostate is enlarged (firm). Prostate is not tender. He exhibits epididymal tenderness (on the left side - enlarged epididymis).  Musculoskeletal:       Right lower leg: He exhibits no edema.       Left lower leg: He exhibits no edema.  Neurological: He is alert and oriented to person, place, and time. Gait normal.  Skin: Skin is warm and dry.  Psychiatric: Mood, memory, affect and judgment normal.   Results for orders placed or performed in visit on 08/09/15  COMPLETE METABOLIC PANEL WITH GFR  Result Value Ref Range   Sodium 138 135 - 146 mmol/L   Potassium 4.1 3.5 - 5.3 mmol/L   Chloride 102 98 - 110 mmol/L   CO2 28 20 - 31 mmol/L   Glucose, Bld 104 (H) 65 - 99 mg/dL   BUN 12 7 - 25 mg/dL   Creat 0.97 0.70 - 1.25 mg/dL   Total Bilirubin 0.3 0.2 - 1.2 mg/dL   Alkaline Phosphatase 60 40 - 115 U/L   AST 23 10 - 35 U/L   ALT 19 9 - 46 U/L   Total Protein 6.9 6.1 - 8.1 g/dL   Albumin 4.0 3.6 - 5.1 g/dL   Calcium 8.3 (L) 8.6 - 10.3 mg/dL   GFR, Est African American >89 >=60 mL/min  GFR, Est Non African American 82 >=60 mL/min  Lipid panel  Result Value Ref Range   Cholesterol 151 125 - 200 mg/dL   Triglycerides 199 (H) <150 mg/dL   HDL 38 (L) >=40 mg/dL   Total CHOL/HDL Ratio 4.0 <=5.0 Ratio   VLDL 40 (H) <30 mg/dL   LDL Cholesterol 73 <130 mg/dL  POCT urinalysis dipstick  Result Value Ref Range   Color, UA yellow yellow   Clarity, UA clear clear   Glucose, UA negative negative   Bilirubin, UA negative negative   Ketones, POC UA negative negative   Spec Grav, UA 1.010    Blood, UA small (A) negative   pH, UA 6.5    Protein Ur, POC negative negative   Urobilinogen, UA 0.2    Nitrite, UA Negative Negative   Leukocytes, UA Negative Negative  POCT Microscopic Urinalysis ([facility])  Result Value Ref Range   WBC,UR,HPF,POC None None WBC/hpf    RBC,UR,HPF,POC None None RBC/hpf   Bacteria None None, Too numerous to count   Mucus Absent Absent   Epithelial Cells, UR Per Microscopy None None, Too numerous to count cells/hpf    Assessment and Plan :  Dysuria - Plan: POCT urinalysis dipstick, POCT Microscopic Urinalysis ([facility]), ciprofloxacin (CIPRO) 500 MG tablet - cover for epididymitis - f/u if not getting better if he does not get a urology appt in 2-3 weeks  Benign essential HTN - Plan: COMPLETE METABOLIC PANEL WITH GFR, amLODipine (NORVASC) 5 MG tablet - continue current medications  Elevated cholesterol - Plan: Lipid panel - refill medications based on lab results.  Abnormal prostate exam - Plan: Ambulatory referral to Urology  Hematuria - Plan: Ambulatory referral to Urology  Windell Hummingbird PA-C  [facility] 08/14/2015 3:23 PM

## 2015-08-10 LAB — COMPLETE METABOLIC PANEL WITH GFR
ALT: 19 U/L (ref 9–46)
AST: 23 U/L (ref 10–35)
Albumin: 4 g/dL (ref 3.6–5.1)
Alkaline Phosphatase: 60 U/L (ref 40–115)
BUN: 12 mg/dL (ref 7–25)
CALCIUM: 8.3 mg/dL — AB (ref 8.6–10.3)
CHLORIDE: 102 mmol/L (ref 98–110)
CO2: 28 mmol/L (ref 20–31)
Creat: 0.97 mg/dL (ref 0.70–1.25)
GFR, EST NON AFRICAN AMERICAN: 82 mL/min (ref >=60)
Glucose, Bld: 104 mg/dL — ABNORMAL HIGH (ref 65–99)
POTASSIUM: 4.1 mmol/L (ref 3.5–5.3)
Sodium: 138 mmol/L (ref 135–146)
Total Bilirubin: 0.3 mg/dL (ref 0.2–1.2)
Total Protein: 6.9 g/dL (ref 6.1–8.1)

## 2015-08-10 LAB — LIPID PANEL
CHOL/HDL RATIO: 4 ratio (ref <=5.0)
CHOLESTEROL: 151 mg/dL (ref 125–200)
HDL: 38 mg/dL — ABNORMAL LOW (ref >=40)
LDL Cholesterol: 73 mg/dL (ref <130)
TRIGLYCERIDES: 199 mg/dL — AB (ref <150)
VLDL: 40 mg/dL — AB (ref <30)

## 2015-08-14 ENCOUNTER — Telehealth: Payer: Self-pay | Admitting: Physician Assistant

## 2015-08-14 MED ORDER — PRAVASTATIN SODIUM 40 MG PO TABS: 40.0000 mg | ORAL_TABLET | Freq: Every day | ORAL | Status: DC

## 2015-08-14 NOTE — Telephone Encounter (Signed)
Patrick Abbott  Please see previous message 

## 2015-08-14 NOTE — Telephone Encounter (Signed)
Patient received a letter in the mail requesting him to do [place] duty. Patient need Judson Roch to write a letter stating he is not able to attend jury duty because of his urination problem. [phone]. Please call patient when letter is ready. Madaline Savage Duty is scheduled for March 1st.

## 2015-08-15 ENCOUNTER — Encounter: Payer: Self-pay | Admitting: Physician Assistant

## 2015-08-15 MED ORDER — PRAVASTATIN SODIUM 40 MG PO TABS: 40.0000 mg | ORAL_TABLET | Freq: Every day | ORAL | Status: DC

## 2015-08-15 NOTE — Addendum Note (Signed)
Addended by: Mancel Bale on: 08/15/2015 12:58 PM   Modules accepted: Orders

## 2015-08-15 NOTE — Telephone Encounter (Signed)
Which problem is preventing him from thinking he can do jury duty?  Patrick Abbott

## 2015-08-15 NOTE — Telephone Encounter (Signed)
Patient states that he unable to sit for a long time and he has to use the bathroom every 15-20 minutes

## 2015-08-16 NOTE — Telephone Encounter (Signed)
Letter is written.

## 2015-08-16 NOTE — Telephone Encounter (Signed)
Pt has already picked the letter up

## 2015-08-22 ENCOUNTER — Telehealth: Payer: Self-pay

## 2015-08-22 NOTE — Telephone Encounter (Signed)
Sarah  Please see previous message 

## 2015-08-22 NOTE — Telephone Encounter (Signed)
Patient was recently seen because the medication he was prescribed for burning while urinating isn't working. Please advise!  [phone]

## 2015-08-23 NOTE — Telephone Encounter (Signed)
I am not sure what to do next.  He has an appt on 3/21 with urology - if he cannot wait that long please have him RTC.  Did the abx I gave him help at all and then the symptoms come back?

## 2015-08-25 ENCOUNTER — Ambulatory Visit (INDEPENDENT_AMBULATORY_CARE_PROVIDER_SITE_OTHER): Payer: BLUE CROSS/BLUE SHIELD

## 2015-08-25 ENCOUNTER — Ambulatory Visit (INDEPENDENT_AMBULATORY_CARE_PROVIDER_SITE_OTHER): Payer: BLUE CROSS/BLUE SHIELD | Admitting: Urgent Care

## 2015-08-25 VITALS — BP 132/70 | HR 82 | Temp 98.6°F | Resp 16 | Ht 65.5 in | Wt 142.0 lb

## 2015-08-25 DIAGNOSIS — R300 Dysuria: Secondary | ICD-10-CM

## 2015-08-25 DIAGNOSIS — R350 Frequency of micturition: Secondary | ICD-10-CM | POA: Diagnosis not present

## 2015-08-25 DIAGNOSIS — R3912 Poor urinary stream: Secondary | ICD-10-CM

## 2015-08-25 DIAGNOSIS — R3129 Other microscopic hematuria: Secondary | ICD-10-CM

## 2015-08-25 DIAGNOSIS — R351 Nocturia: Secondary | ICD-10-CM | POA: Diagnosis not present

## 2015-08-25 LAB — POCT CBC
GRANULOCYTE PERCENT: 66.9 % (ref 37–80)
HCT, POC: 37.6 % — AB (ref 43.5–53.7)
Hemoglobin: 13.8 g/dL — AB (ref 14.1–18.1)
Lymph, poc: 2.6 (ref 0.6–3.4)
MCH: 28.7 pg (ref 27–31.2)
MCHC: 36.8 g/dL — AB (ref 31.8–35.4)
MCV: 77.9 fL — AB (ref 80–97)
MID (CBC): 0.3 (ref 0–0.9)
MPV: 7.3 fL (ref 0–99.8)
PLATELET COUNT, POC: 319 10*3/uL (ref 142–424)
POC GRANULOCYTE: 5.9 (ref 2–6.9)
POC LYMPH PERCENT: 29.9 %L (ref 10–50)
POC MID %: 3.2 %M (ref 0–12)
RBC: 4.83 M/uL (ref 4.69–6.13)
RDW, POC: 13.1 %
WBC: 8.8 10*3/uL (ref 4.6–10.2)

## 2015-08-25 LAB — POCT URINALYSIS DIP (MANUAL ENTRY)
Bilirubin, UA: NEGATIVE
Glucose, UA: NEGATIVE
Ketones, POC UA: NEGATIVE
Leukocytes, UA: NEGATIVE
NITRITE UA: NEGATIVE
PROTEIN UA: NEGATIVE
SPEC GRAV UA: 1.015
UROBILINOGEN UA: 0.2
pH, UA: 5.5

## 2015-08-25 LAB — POC MICROSCOPIC URINALYSIS (UMFC)

## 2015-08-25 MED ORDER — DOXAZOSIN MESYLATE 1 MG PO TABS: 1.0000 mg | ORAL_TABLET | Freq: Every day | ORAL | Status: DC

## 2015-08-25 MED ORDER — TAMSULOSIN HCL 0.4 MG PO CAPS: 0.4000 mg | ORAL_CAPSULE | Freq: Every day | ORAL | Status: DC

## 2015-08-25 NOTE — Progress Notes (Signed)
MRN: [id number] DOB: 1951-01-14  Subjective:   Patrick Abbott is a 65 y.o. male presenting for follow up on dysuria. Reports ~4 week history of worsening dysuria, urinary frequency, weakened urinary stream, nocturia. Has been seen here before for same problem, tried course of ciprofloxacin without any relief. He was referred to a urologist and has an appointment on 09/11/2015. Denies fever, n/v, abdominal pain, cloudy urine, hematuria, history of renal stones or bph. Denies smoking cigarettes or drinking alcohol.   Myers has a current medication list which includes the following prescription(s): amlodipine, aspirin, pravastatin, cholecalciferol, and zoster vaccine live (pf). Also has No Known Allergies.  Aristidis  has a past medical history of Hyperlipidemia and Hypertension. Also  has past surgical history that includes Hernia repair.  Objective:   Vitals: BP 132/70 mmHg  Pulse 82  Temp(Src) 98.6 F (37 C) (Oral)  Resp 16  Ht 5' 5.5" (1.664 m)  Wt 142 lb (64.411 kg)  BMI 23.26 kg/m2  SpO2 99%  Wt Readings from Last 3 Encounters:  08/25/15 142 lb (64.411 kg)  08/09/15 140 lb (63.504 kg)  04/10/15 131 lb 3.2 oz (59.512 kg)   Physical Exam  Constitutional: He is oriented to person, place, and time. He appears well-developed and well-nourished.  Cardiovascular: Normal rate, regular rhythm and intact distal pulses.  Exam reveals no gallop and no friction rub.   No murmur heard. Pulmonary/Chest: No respiratory distress. He has no wheezes. He has no rales.  Abdominal: Soft. Bowel sounds are normal. He exhibits no distension and no mass. There is no tenderness.  Neurological: He is alert and oriented to person, place, and time.  Skin: Skin is warm and dry.    Results for orders placed or performed in visit on 08/25/15 (from the past 24 hour(s))  POCT urinalysis dipstick     Status: Abnormal   Collection Time: 08/25/15 10:48 AM  Result Value Ref Range   Color, UA yellow yellow   Clarity, UA clear clear   Glucose, UA negative negative   Bilirubin, UA negative negative   Ketones, POC UA negative negative   Spec Grav, UA 1.015    Blood, UA trace-lysed (A) negative   pH, UA 5.5    Protein Ur, POC negative negative   Urobilinogen, UA 0.2    Nitrite, UA Negative Negative   Leukocytes, UA Negative Negative  POCT Microscopic Urinalysis ([facility])     Status: Abnormal   Collection Time: 08/25/15 10:48 AM  Result Value Ref Range   WBC,UR,HPF,POC None None WBC/hpf   RBC,UR,HPF,POC Few (A) None RBC/hpf   Bacteria Few (A) None, Too numerous to count   Mucus Present (A) Absent   Epithelial Cells, UR Per Microscopy None None, Too numerous to count cells/hpf  POCT CBC     Status: Abnormal   Collection Time: 08/25/15 10:48 AM  Result Value Ref Range   WBC 8.8 4.6 - 10.2 K/uL   Lymph, poc 2.6 0.6 - 3.4   POC LYMPH PERCENT 29.9 10 - 50 %L   MID (cbc) 0.3 0 - 0.9   POC MID % 3.2 0 - 12 %M   POC Granulocyte 5.9 2 - 6.9   Granulocyte percent 66.9 37 - 80 %G   RBC 4.83 4.69 - 6.13 M/uL   Hemoglobin 13.8 (A) 14.1 - 18.1 g/dL   HCT, POC 37.6 (A) 43.5 - 53.7 %   MCV 77.9 (A) 80 - 97 fL   MCH, POC 28.7 27 - 31.2 pg  MCHC 36.8 (A) 31.8 - 35.4 g/dL   RDW, POC 13.1 %   Platelet Count, POC 319 142 - 424 K/uL   MPV 7.3 0 - 99.8 fL   Dg Pelvis 1-2 Views  08/25/2015  CLINICAL DATA:  Dysuria.  Microscopic hematuria. EXAM: PELVIS - 1-2 VIEW COMPARISON:  None. FINDINGS: There is a calcification in the deep right pelvis. No other bony or soft tissue abnormalities. IMPRESSION: There is a rounded calcification in the deep right pelvis. This may simply be a phlebolith. However, with a history of dysuria and a microscopic hematuria, a distal ureteral stone cannot be excluded on this study alone. Electronically Signed   By: Dorise Bullion III M.D   On: 08/25/2015 11:02   Assessment and Plan :   1. Dysuria 2. Weak urinary stream 3. Nocturia 4. Urinary frequency 5. Microscopic hematuria -  Given HPI and x-ray over-read, I suspect that patient has ureteral stone. Patient is to strain urine, advised aggressive hydration, start Flomax. Patient will try to call the urologist and move up his appointment. I counseled on the possibility that he has BPH, he will start doxazosin, low dose to address and follow up with urologist.  [name], PA-C [facility] [phone] 08/25/2015 10:26 AM

## 2015-08-25 NOTE — Patient Instructions (Addendum)
Because you received labwork today, you will receive an invoice from [employer]. Please contact [facility] at [phone] with questions or concerns regarding your invoice. Our billing staff will not be able to assist you with those questions.  You will be contacted with the lab results as soon as they are available. The fastest way to get your results is to activate your My Chart account. Instructions are located on the last page of this paperwork. If you have not heard from [place] regarding the results in 2 weeks, please contact this office.  Because you received an x-ray today, you will receive an invoice from [facility]. Please contact [facility] at [phone] with questions or concerns regarding your invoice. Our billing staff will not be able to assist you with those questions.    Kidney Stones Kidney stones (urolithiasis) are deposits that form inside your kidneys. The intense pain is caused by the stone moving through the urinary tract. When the stone moves, the ureter goes into spasm around the stone. The stone is usually passed in the urine.  CAUSES   A disorder that makes certain neck glands produce too much parathyroid hormone (primary hyperparathyroidism).  A buildup of uric acid crystals, similar to gout in your joints.  Narrowing (stricture) of the ureter.  A kidney obstruction present at birth (congenital obstruction).  Previous surgery on the kidney or ureters.  Numerous kidney infections. SYMPTOMS   Feeling sick to your stomach (nauseous).  Throwing up (vomiting).  Blood in the urine (hematuria).  Pain that usually spreads (radiates) to the groin.  Frequency or urgency of urination. DIAGNOSIS   Taking a history and physical exam.  Blood or urine tests.  CT scan.  Occasionally, an examination of the inside of the urinary bladder (cystoscopy) is performed. TREATMENT   Observation.  Increasing your fluid  intake.  Extracorporeal shock wave lithotripsy--This is a noninvasive procedure that uses shock waves to break up kidney stones.  Surgery may be needed if you have severe pain or persistent obstruction. There are various surgical procedures. Most of the procedures are performed with the use of small instruments. Only small incisions are needed to accommodate these instruments, so recovery time is minimized. The size, location, and chemical composition are all important variables that will determine the proper choice of action for you. Talk to your health care provider to better understand your situation so that you will minimize the risk of injury to yourself and your kidney.  HOME CARE INSTRUCTIONS   Drink enough water and fluids to keep your urine clear or pale yellow. This will help you to pass the stone or stone fragments.  Strain all urine through the provided strainer. Keep all particulate matter and stones for your health care provider to see. The stone causing the pain may be as small as a grain of salt. It is very important to use the strainer each and every time you pass your urine. The collection of your stone will allow your health care provider to analyze it and verify that a stone has actually passed. The stone analysis will often identify what you can do to reduce the incidence of recurrences.  Only take over-the-counter or prescription medicines for pain, discomfort, or fever as directed by your health care provider.  Keep all follow-up visits as told by your health care provider. This is important.  Get follow-up X-rays if required. The absence of pain does not always mean that the stone has passed. It may have  only stopped moving. If the urine remains completely obstructed, it can cause loss of kidney function or even complete destruction of the kidney. It is your responsibility to make sure X-rays and follow-ups are completed. Ultrasounds of the kidney can show blockages and the  status of the kidney. Ultrasounds are not associated with any radiation and can be performed easily in a matter of minutes.  Make changes to your daily diet as told by your health care provider. You may be told to:  Limit the amount of salt that you eat.  Eat 5 or more servings of fruits and vegetables each day.  Limit the amount of meat, poultry, fish, and eggs that you eat.  Collect a 24-hour urine sample as told by your health care provider.You may need to collect another urine sample every 6-12 months. SEEK MEDICAL CARE IF:  You experience pain that is progressive and unresponsive to any pain medicine you have been prescribed. SEEK IMMEDIATE MEDICAL CARE IF:   Pain cannot be controlled with the prescribed medicine.  You have a fever or shaking chills.  The severity or intensity of pain increases over 18 hours and is not relieved by pain medicine.  You develop a new onset of abdominal pain.  You feel faint or pass out.  You are unable to urinate.   This information is not intended to replace advice given to you by your health care provider. Make sure you discuss any questions you have with your health care provider.   Document Released: 06/09/2005 Document Revised: 02/28/2015 Document Reviewed: 11/10/2012 Elsevier Interactive Patient Education [employer].

## 2015-08-26 LAB — URINE CULTURE
Colony Count: NO GROWTH
Organism ID, Bacteria: NO GROWTH

## 2015-08-27 LAB — PSA: PSA: 1.29 ng/mL (ref <=4.00)

## 2015-10-04 ENCOUNTER — Ambulatory Visit (INDEPENDENT_AMBULATORY_CARE_PROVIDER_SITE_OTHER): Payer: BLUE CROSS/BLUE SHIELD | Admitting: Physician Assistant

## 2015-10-04 ENCOUNTER — Encounter: Payer: Self-pay | Admitting: Physician Assistant

## 2015-10-04 VITALS — BP 110/72 | HR 84 | Temp 98.2°F | Resp 16 | Ht 64.0 in | Wt 138.0 lb

## 2015-10-04 DIAGNOSIS — Z1211 Encounter for screening for malignant neoplasm of colon: Secondary | ICD-10-CM | POA: Diagnosis not present

## 2015-10-04 DIAGNOSIS — Z130 Encounter for screening for diseases of the blood and blood-forming organs and certain disorders involving the immune mechanism: Secondary | ICD-10-CM | POA: Diagnosis not present

## 2015-10-04 DIAGNOSIS — I10 Essential (primary) hypertension: Secondary | ICD-10-CM | POA: Diagnosis not present

## 2015-10-04 DIAGNOSIS — Z114 Encounter for screening for human immunodeficiency virus [HIV]: Secondary | ICD-10-CM

## 2015-10-04 DIAGNOSIS — Z23 Encounter for immunization: Secondary | ICD-10-CM | POA: Diagnosis not present

## 2015-10-04 DIAGNOSIS — R351 Nocturia: Secondary | ICD-10-CM

## 2015-10-04 DIAGNOSIS — N400 Enlarged prostate without lower urinary tract symptoms: Secondary | ICD-10-CM | POA: Diagnosis not present

## 2015-10-04 DIAGNOSIS — Z0000 Encounter for general adult medical examination without abnormal findings: Secondary | ICD-10-CM | POA: Diagnosis not present

## 2015-10-04 DIAGNOSIS — Z1159 Encounter for screening for other viral diseases: Secondary | ICD-10-CM

## 2015-10-04 DIAGNOSIS — N401 Enlarged prostate with lower urinary tract symptoms: Secondary | ICD-10-CM | POA: Insufficient documentation

## 2015-10-04 DIAGNOSIS — E7800 Pure hypercholesterolemia, unspecified: Secondary | ICD-10-CM

## 2015-10-04 LAB — COMPLETE METABOLIC PANEL WITH GFR
ALT: 22 U/L (ref 9–46)
AST: 24 U/L (ref 10–35)
Albumin: 4 g/dL (ref 3.6–5.1)
Alkaline Phosphatase: 51 U/L (ref 40–115)
BUN: 15 mg/dL (ref 7–25)
CALCIUM: 8.7 mg/dL (ref 8.6–10.3)
CHLORIDE: 103 mmol/L (ref 98–110)
CO2: 25 mmol/L (ref 20–31)
Creat: 1.17 mg/dL (ref 0.70–1.25)
GFR, EST AFRICAN AMERICAN: 75 mL/min (ref >=60)
GFR, EST NON AFRICAN AMERICAN: 65 mL/min (ref >=60)
Glucose, Bld: 134 mg/dL — ABNORMAL HIGH (ref 65–99)
POTASSIUM: 3.7 mmol/L (ref 3.5–5.3)
Sodium: 138 mmol/L (ref 135–146)
Total Bilirubin: 0.6 mg/dL (ref 0.2–1.2)
Total Protein: 6.9 g/dL (ref 6.1–8.1)

## 2015-10-04 LAB — CBC WITH DIFFERENTIAL/PLATELET
BASOS PCT: 0 %
Basophils Absolute: 0 cells/uL (ref 0–200)
EOS ABS: 64 {cells}/uL (ref 15–500)
Eosinophils Relative: 1 %
HEMATOCRIT: 37.2 % — AB (ref 38.5–50.0)
Hemoglobin: 12.4 g/dL — ABNORMAL LOW (ref 13.2–17.1)
LYMPHS ABS: 1856 {cells}/uL (ref 850–3900)
LYMPHS PCT: 29 %
MCH: 26.7 pg — ABNORMAL LOW (ref 27.0–33.0)
MCHC: 33.3 g/dL (ref 32.0–36.0)
MCV: 80.2 fL (ref 80.0–100.0)
MONO ABS: 384 {cells}/uL (ref 200–950)
MPV: 9.3 fL (ref 7.5–12.5)
Monocytes Relative: 6 %
NEUTROS ABS: 4096 {cells}/uL (ref 1500–7800)
Neutrophils Relative %: 64 %
Platelets: 284 10*3/uL (ref 140–400)
RBC: 4.64 MIL/uL (ref 4.20–5.80)
RDW: 15 % (ref 11.0–15.0)
WBC: 6.4 10*3/uL (ref 3.8–10.8)

## 2015-10-04 LAB — LIPID PANEL
CHOL/HDL RATIO: 3.7 ratio (ref <=5.0)
CHOLESTEROL: 164 mg/dL (ref 125–200)
HDL: 44 mg/dL (ref >=40)
LDL Cholesterol: 88 mg/dL (ref <130)
TRIGLYCERIDES: 161 mg/dL — AB (ref <150)
VLDL: 32 mg/dL — AB (ref <30)

## 2015-10-04 LAB — HIV ANTIBODY (ROUTINE TESTING W REFLEX): HIV: NONREACTIVE

## 2015-10-04 LAB — TSH: TSH: 0.53 mIU/L (ref 0.40–4.50)

## 2015-10-04 MED ORDER — PRAVASTATIN SODIUM 40 MG PO TABS: 40.0000 mg | ORAL_TABLET | Freq: Every day | ORAL | Status: DC

## 2015-10-04 MED ORDER — AMLODIPINE BESYLATE 5 MG PO TABS: 5.0000 mg | ORAL_TABLET | Freq: Every day | ORAL | Status: DC

## 2015-10-04 NOTE — Patient Instructions (Addendum)
Keeping you healthy  Get these tests  Blood pressure- Have your blood pressure checked once a year by your healthcare provider.  Normal blood pressure is 120/80  Weight- Have your body mass index (BMI) calculated to screen for obesity.  BMI is a measure of body fat based on height and weight. You can also calculate your own BMI at [email or web address].[email or web address]/bmi/.  Cholesterol- Have your cholesterol checked every year.  Diabetes- Have your blood sugar checked regularly if you have high blood pressure, high cholesterol, have a family history of diabetes or if you are overweight.  Screening for Colon Cancer- Colonoscopy starting at age 50.  Screening may begin sooner depending on your family history and other health conditions. Follow up colonoscopy as directed by your Gastroenterologist.  Screening for Prostate Cancer- Both blood work (PSA) and a rectal exam help screen for Prostate Cancer.  Screening begins at age 40 with African-American men and at age 50 with Caucasian men.  Screening may begin sooner depending on your family history.  Take these medicines  Aspirin- One aspirin daily can help prevent Heart disease and Stroke.  Flu shot- Every fall.  Tetanus- Every 10 years.  Zostavax- Once after the age of 60 to prevent Shingles.  Pneumonia shot- Once after the age of 65; if you are younger than 65, ask your healthcare provider if you need a Pneumonia shot.  Take these steps  Don't smoke- If you do smoke, talk to your doctor about quitting.  For tips on how to quit, go to www.smokefree.gov or call 1-800-QUIT-NOW.  Be physically active- Exercise 5 days a week for at least 30 minutes.  If you are not already physically active start slow and gradually work up to 30 minutes of moderate physical activity.  Examples of moderate activity include walking briskly, mowing the yard, dancing, swimming, bicycling, etc.  Eat a healthy diet- Eat a variety of healthy food such as fruits, vegetables, low  fat milk, low fat cheese, yogurt, lean meant, poultry, fish, beans, tofu, etc. For more information go to www.thenutritionsource.org  Drink alcohol in moderation- Limit alcohol intake to less than two drinks a day. Never drink and drive.  Dentist- Brush and floss twice daily; visit your dentist twice a year.  Depression- Your emotional health is as important as your physical health. If you're feeling down, or losing interest in things you would normally enjoy please talk to your healthcare provider.  Eye exam- Visit your eye doctor every year.  Safe sex- If you may be exposed to a sexually transmitted infection, use a condom.  Seat belts- Seat belts can save your life; always wear one.  Smoke/Carbon Monoxide detectors- These detectors need to be installed on the appropriate level of your home.  Replace batteries at least once a year.  Skin cancer- When out in the sun, cover up and use sunscreen 15 SPF or higher.  Violence- If anyone is threatening you, please tell your healthcare provider.  Living Will/ Health care power of attorney- Speak with your healthcare provider and family.    IF you received an x-ray today, you will receive an invoice from [facility]. Please contact [facility] at [phone] with questions or concerns regarding your invoice.   IF you received labwork today, you will receive an invoice from [facility]. Please contact [facility] at [phone] with questions or concerns regarding your invoice.   Our billing staff will not be able to assist you with questions regarding bills from these companies.    You will be contacted with the lab results as soon as they are available. The fastest way to get your results is to activate your My Chart account. Instructions are located on the last page of this paperwork. If you have not heard from us regarding the results in 2 weeks, please contact this office.      

## 2015-10-04 NOTE — Progress Notes (Signed)
Patrick Abbott  MRN: [id number] DOB: Jul 20, 1950  Subjective:  Pt presents to clinic for a CPE. He is doing well and having no problems.  He recently saw the urologist and his prostate was normal size he thinks - he medication that he was started on is helping his symptoms and his urine stream is no longer weak.  Last dental exam: every 6 months Last vision exam: wears glasses - >5 years ago Last colonoscopy: declines today Vaccinations      Tetanus - 2014      Zostavax - believes he has had - we will call the pharmacy  Patient Active Problem List   Diagnosis Date Noted  . BPH (benign prostatic hypertrophy) 10/04/2015  . Benign essential HTN 09/16/2014  . Pure hypercholesterolemia 09/16/2014  . CTS (carpal tunnel syndrome) 02/18/2014    Current Outpatient Prescriptions on File Prior to Visit  Medication Sig Dispense Refill  . amLODipine (NORVASC) 5 MG tablet Take 1 tablet (5 mg total) by mouth daily. 90 tablet 1  . aspirin 325 MG tablet Take 325 mg by mouth daily. Reported on 10/04/2015    . doxazosin (CARDURA) 1 MG tablet Take 1 tablet (1 mg total) by mouth daily. 30 tablet 6  . pravastatin (PRAVACHOL) 40 MG tablet Take 1 tablet (40 mg total) by mouth daily. 90 tablet 1  . tamsulosin (FLOMAX) 0.4 MG CAPS capsule Take 1 capsule (0.4 mg total) by mouth daily after breakfast. 30 capsule 6  . VITAMIN D, CHOLECALCIFEROL, PO Take 1 tablet by mouth daily.    Marland Kitchen zoster vaccine live, PF, (ZOSTAVAX) 60454 UNT/0.65ML injection Inject 19,400 Units into the skin once. 1 each 0   No current facility-administered medications on file prior to visit.    No Known Allergies  Social History   Social History  . Marital Status: Married    Spouse Name: [name]  . Number of Children: [name]  . Years of Education: [name]   Social History Main Topics  . Smoking status: Never Smoker   . Smokeless tobacco: None  . Alcohol Use: No  . Drug Use: No  . Sexual Activity: Yes   Other Topics Concern  . None    Social History Narrative   Marital status: married x 32 years; moved from [place] in 1982.      Children: 3 children; 1 grandchild.      Employment:  Water engineer.      Tobacco: none      Alcohol:  Once per month      Exercise:  Weights, walking.    Past Surgical History  Procedure Laterality Date  . Hernia repair      Left    No family history on file.  Review of Systems  Constitutional: Negative.   HENT: Negative.   Eyes: Negative.   Respiratory: Negative.   Cardiovascular: Negative.   Gastrointestinal: Negative.   Endocrine: Negative.   Genitourinary: Negative.   Musculoskeletal: Negative.   Skin: Negative.   Allergic/Immunologic: Negative.   Neurological: Negative.   Hematological: Negative.   Psychiatric/Behavioral: Negative.    Objective:  BP 110/72 mmHg  Pulse 84  Temp(Src) 98.2 F (36.8 C) (Oral)  Resp 16  Ht 5\' 4"  (1.626 m)  Wt 138 lb (62.596 kg)  BMI 23.68 kg/m2  SpO2 98%  Physical Exam  Constitutional: He is oriented to person, place, and time and well-developed, well-nourished, and in no distress.  HENT:  Head: Normocephalic and atraumatic.  Right Ear: Hearing, tympanic membrane, external  ear and ear canal normal.  Left Ear: Hearing, tympanic membrane, external ear and ear canal normal.  Nose: Nose normal.  Mouth/Throat: Uvula is midline, oropharynx is clear and moist and mucous membranes are normal.  Eyes: Conjunctivae and EOM are normal. Pupils are equal, round, and reactive to light.  Neck: Trachea normal and normal range of motion. Neck supple. No thyroid mass and no thyromegaly present.  Cardiovascular: Normal rate, regular rhythm and normal heart sounds.   No murmur heard. Pulmonary/Chest: Effort normal and breath sounds normal.  Abdominal: Soft. Bowel sounds are normal.  Musculoskeletal: Normal range of motion.  Neurological: He is alert and oriented to person, place, and time. Gait normal.  Skin: Skin is warm and dry.   Psychiatric: Mood, memory, affect and judgment normal.    Visual Acuity Screening   Right eye Left eye Both eyes  Without correction:     With correction: 20/30 20/40 20/40     Assessment and Plan :  Annual physical exam - anticipatory guidance - suggested he make an appt with eye MD for refractory exam  BPH (benign prostatic hypertrophy) - continue current medications  Screening for HIV (human immunodeficiency virus) - Plan: HIV antibody  Need for hepatitis C screening test - Plan: Hepatitis C antibody  Essential hypertension - Plan: COMPLETE METABOLIC PANEL WITH GFR, TSH - well controlled -   Need for pneumococcal vaccination - Plan: Pneumococcal conjugate vaccine 13-valent  Screening for deficiency anemia - Plan: CBC with Differential/Platelet  Pure hypercholesterolemia - Plan: Lipid panel - continue current medications  Screening for colon cancer - Plan: IFOBT POC (occult bld, rslt in office) - pt will do at home and return - he does not want to have a colonoscopy at this time.  Windell Hummingbird PA-C  [facility] 10/04/2015 3:20 PM

## 2015-10-05 ENCOUNTER — Encounter: Payer: Self-pay | Admitting: Physician Assistant

## 2015-10-05 LAB — IFOBT (OCCULT BLOOD): IFOBT: NEGATIVE

## 2015-10-05 LAB — HEPATITIS C ANTIBODY: HCV Ab: NEGATIVE

## 2015-10-10 ENCOUNTER — Encounter: Payer: BLUE CROSS/BLUE SHIELD | Admitting: Physician Assistant

## 2015-10-11 ENCOUNTER — Encounter: Payer: BLUE CROSS/BLUE SHIELD | Admitting: Physician Assistant

## 2015-10-18 ENCOUNTER — Encounter: Payer: BLUE CROSS/BLUE SHIELD | Admitting: Physician Assistant

## 2016-02-26 ENCOUNTER — Other Ambulatory Visit: Payer: Self-pay | Admitting: Urgent Care

## 2016-03-24 ENCOUNTER — Other Ambulatory Visit: Payer: Self-pay | Admitting: Urgent Care

## 2016-03-25 NOTE — Telephone Encounter (Signed)
09/2015 last eam and labs 08/2015 last normal psa

## 2016-03-25 NOTE — Telephone Encounter (Signed)
We can refill, he should be following up with his urologist regarding the 2 meds.

## 2016-04-24 ENCOUNTER — Other Ambulatory Visit: Payer: Self-pay | Admitting: Physician Assistant

## 2016-05-25 ENCOUNTER — Other Ambulatory Visit: Payer: Self-pay | Admitting: Family Medicine

## 2016-05-25 NOTE — Telephone Encounter (Signed)
Last ov and nl psa 09/2015

## 2016-06-02 ENCOUNTER — Ambulatory Visit (INDEPENDENT_AMBULATORY_CARE_PROVIDER_SITE_OTHER): Payer: BLUE CROSS/BLUE SHIELD | Admitting: Urgent Care

## 2016-06-02 VITALS — BP 114/76 | HR 77 | Temp 97.7°F | Resp 17 | Ht 64.0 in | Wt 134.0 lb

## 2016-06-02 DIAGNOSIS — I10 Essential (primary) hypertension: Secondary | ICD-10-CM

## 2016-06-02 DIAGNOSIS — Z23 Encounter for immunization: Secondary | ICD-10-CM | POA: Diagnosis not present

## 2016-06-02 MED ORDER — AMLODIPINE BESYLATE 5 MG PO TABS: 5.0000 mg | ORAL_TABLET | Freq: Every day | ORAL | 1 refills | Status: DC

## 2016-06-02 NOTE — Progress Notes (Addendum)
    MRN: [id number] DOB: 1950/08/02  Subjective:   Patrick Abbott is a 65 y.o. male presenting for follow up on Hypertension.   Currently managed with amlodipine. Patient is checking blood pressure at home, generally 123456 systolic. Avoids salt in diet, is exercising. Reports occasional heartburn from eating spicy foods. Resolves on its own with PPI, H-2 blocker. Denies dizziness, chronic headache, blurred vision, chest pain, shortness of breath, heart racing, palpitations, nausea, vomiting, abdominal pain, hematuria, lower leg swelling. Denies smoking cigarettes.  Patrick Abbott has a current medication list which includes the following prescription(s): amlodipine, aspirin, doxazosin, pravastatin, tamsulosin, and cholecalciferol. Also has No Known Allergies.  Patrick Abbott  has a past medical history of Depression; Hyperlipidemia; and Hypertension. Also  has a past surgical history that includes Hernia repair.   Objective:   Vitals: BP 114/76 (BP Location: Right Arm, Patient Position: Sitting, Cuff Size: Normal)   Pulse 77   Temp 97.7 F (36.5 C) (Oral)   Resp 17   Ht 5\' 4"  (1.626 m)   Wt 134 lb (60.8 kg)   SpO2 100%   BMI 23.00 kg/m   BP Readings from Last 3 Encounters:  06/02/16 114/76  10/04/15 110/72  08/25/15 132/70    Physical Exam  Constitutional: He is oriented to person, place, and time. He appears well-developed and well-nourished.  HENT:  Mouth/Throat: Oropharynx is clear and moist.  Eyes: No scleral icterus.  Neck: Normal range of motion. Neck supple. No thyromegaly present.  Cardiovascular: Normal rate, regular rhythm and intact distal pulses.  Exam reveals no gallop and no friction rub.   No murmur heard. Pulmonary/Chest: No respiratory distress. He has no wheezes. He has no rales.  Abdominal: Soft. Bowel sounds are normal. He exhibits no distension and no mass. There is no tenderness. There is no guarding.  Musculoskeletal: He exhibits no edema.  Neurological: He is alert and  oriented to person, place, and time.  Skin: Skin is warm and dry.   Assessment and Plan :   1. Essential hypertension - Well controlled, refilled amlodipine. Labs are pending. Recheck in 6 months.  2. Needs flu shot - Flu Vaccine QUAD 36+ mos PF IM (Fluarix & Fluzone Quad PF)   [name], PA-C [facility] [phone] 06/02/2016 4:38 PM

## 2016-06-02 NOTE — Patient Instructions (Addendum)
IF you received an x-ray today, you will receive an invoice from [facility]. Please contact [facility] at [phone] with questions or concerns regarding your invoice.   IF you received labwork today, you will receive an invoice from [employer]. Please contact [facility] at [phone] with questions or concerns regarding your invoice.   Our billing staff will not be able to assist you with questions regarding bills from these companies.  You will be contacted with the lab results as soon as they are available. The fastest way to get your results is to activate your My Chart account. Instructions are located on the last page of this paperwork. If you have not heard from [place] regarding the results in 2 weeks, please contact this office.     Influenza (Flu) Vaccine (Inactivated or Recombinant): What You Need to Know 1. Why get vaccinated? Influenza ("flu") is a contagious disease that spreads around the [place] every year, usually between October and May. Flu is caused by influenza viruses, and is spread mainly by coughing, sneezing, and close contact. Anyone can get flu. Flu strikes suddenly and can last several days. Symptoms vary by age, but can include:  fever/chills  sore throat  muscle aches  fatigue  cough  headache  runny or stuffy nose Flu can also lead to pneumonia and blood infections, and cause diarrhea and seizures in children. If you have a medical condition, such as heart or lung disease, flu can make it worse. Flu is more dangerous for some people. Infants and young children, people 65 years of age and older, pregnant women, and people with certain health conditions or a weakened immune system are at greatest risk. Each year thousands of people in the [place] die from flu, and many more are hospitalized. Flu vaccine can:  keep you from getting flu,  make flu less severe if you do get it, and  keep  you from spreading flu to your family and other people. 2. Inactivated and recombinant flu vaccines A dose of flu vaccine is recommended every flu season. Children 6 months through 16 years of age may need two doses during the same flu season. Everyone else needs only one dose each flu season. Some inactivated flu vaccines contain a very small amount of a mercury-based preservative called thimerosal. Studies have not shown thimerosal in vaccines to be harmful, but flu vaccines that do not contain thimerosal are available. There is no live flu virus in flu shots. They cannot cause the flu. There are many flu viruses, and they are always changing. Each year a new flu vaccine is made to protect against three or four viruses that are likely to cause disease in the upcoming flu season. But even when the vaccine doesn't exactly match these viruses, it may still provide some protection. Flu vaccine cannot prevent:  flu that is caused by a virus not covered by the vaccine, or  illnesses that look like flu but are not. It takes about 2 weeks for protection to develop after vaccination, and protection lasts through the flu season. 3. Some people should not get this vaccine Tell the person who is giving you the vaccine:  If you have any severe, life-threatening allergies. If you ever had a life-threatening allergic reaction after a dose of flu vaccine, or have a severe allergy to any part of this vaccine, you may be advised not to get vaccinated. Most, but not all, types of flu vaccine contain a  small amount of egg protein.  If you ever had Guillain-Barr Syndrome (also called GBS). Some people with a history of GBS should not get this vaccine. This should be discussed with your doctor.  If you are not feeling well. It is usually okay to get flu vaccine when you have a mild illness, but you might be asked to come back when you feel better. 4. Risks of a vaccine reaction With any medicine, including  vaccines, there is a chance of reactions. These are usually mild and go away on their own, but serious reactions are also possible. Most people who get a flu shot do not have any problems with it. Minor problems following a flu shot include:  soreness, redness, or swelling where the shot was given  hoarseness  sore, red or itchy eyes  cough  fever  aches  headache  itching  fatigue If these problems occur, they usually begin soon after the shot and last 1 or 2 days. More serious problems following a flu shot can include the following:  There may be a small increased risk of Guillain-Barre Syndrome (GBS) after inactivated flu vaccine. This risk has been estimated at 1 or 2 additional cases per million people vaccinated. This is much lower than the risk of severe complications from flu, which can be prevented by flu vaccine.  Young children who get the flu shot along with pneumococcal vaccine (PCV13) and/or DTaP vaccine at the same time might be slightly more likely to have a seizure caused by fever. Ask your doctor for more information. Tell your doctor if a child who is getting flu vaccine has ever had a seizure. Problems that could happen after any injected vaccine:  People sometimes faint after a medical procedure, including vaccination. Sitting or lying down for about 15 minutes can help prevent fainting, and injuries caused by a fall. Tell your doctor if you feel dizzy, or have vision changes or ringing in the ears.  Some people get severe pain in the shoulder and have difficulty moving the arm where a shot was given. This happens very rarely.  Any medication can cause a severe allergic reaction. Such reactions from a vaccine are very rare, estimated at about 1 in a million doses, and would happen within a few minutes to a few hours after the vaccination. As with any medicine, there is a very remote chance of a vaccine causing a serious injury or death. The safety of vaccines  is always being monitored. For more information, visit: http://www.aguilar.org/ 5. What if there is a serious reaction? What should I look for? Look for anything that concerns you, such as signs of a severe allergic reaction, very high fever, or unusual behavior. Signs of a severe allergic reaction can include hives, swelling of the face and throat, difficulty breathing, a fast heartbeat, dizziness, and weakness. These would start a few minutes to a few hours after the vaccination. What should I do?  If you think it is a severe allergic reaction or other emergency that can't wait, call 9-1-1 and get the person to the nearest hospital. Otherwise, call your doctor.  Reactions should be reported to the Vaccine Adverse Event Reporting System (VAERS). Your doctor should file this report, or you can do it yourself through the VAERS web site at www.vaers.SamedayNews.es, or by calling (253)197-4519.  VAERS does not give medical advice. 6. The National Vaccine Injury Compensation Program The [employer] Vaccine Injury Compensation Program (VICP) is a federal program that was created to  compensate people who may have been injured by certain vaccines. Persons who believe they may have been injured by a vaccine can learn about the program and about filing a claim by calling (831)028-3145 or visiting the [place] website at GoldCloset.com.ee. There is a time limit to file a claim for compensation. 7. How can I learn more?  Ask your healthcare provider. He or she can give you the vaccine package insert or suggest other sources of information.  Call your local or state health department.  Contact the Centers for Disease Control and Prevention (CDC):  Call 832-613-4391 (1-800-CDC-INFO) or  Visit CDC's website at https://gibson.com/ Vaccine Information Statement, Inactivated Influenza Vaccine (01/27/2014) This information is not intended to replace advice given to you by your health care provider. Make  sure you discuss any questions you have with your health care provider. Document Released: 04/03/2006 Document Revised: 02/28/2016 Document Reviewed: 02/28/2016 Elsevier Interactive Patient Education  2017 [employer].

## 2016-06-02 NOTE — Addendum Note (Signed)
Addended by: Lanna Poche R on: 06/02/2016 05:08 PM   Modules accepted: Orders

## 2016-06-03 LAB — BASIC METABOLIC PANEL
BUN/Creatinine Ratio: 15 (ref 10–24)
BUN: 16 mg/dL (ref 8–27)
CO2: 25 mmol/L (ref 18–29)
CREATININE: 1.1 mg/dL (ref 0.76–1.27)
Calcium: 9.2 mg/dL (ref 8.6–10.2)
Chloride: 100 mmol/L (ref 96–106)
GFR, EST AFRICAN AMERICAN: 81 mL/min/{1.73_m2} (ref >59)
GFR, EST NON AFRICAN AMERICAN: 70 mL/min/{1.73_m2} (ref >59)
Glucose: 82 mg/dL (ref 65–99)
POTASSIUM: 4.3 mmol/L (ref 3.5–5.2)
SODIUM: 140 mmol/L (ref 134–144)

## 2016-06-03 LAB — MICROALBUMIN, URINE: Microalbumin, Urine: <3 ug/mL

## 2016-06-04 ENCOUNTER — Encounter: Payer: Self-pay | Admitting: Urgent Care

## 2016-08-19 ENCOUNTER — Other Ambulatory Visit: Payer: Self-pay | Admitting: Physician Assistant

## 2016-09-04 ENCOUNTER — Telehealth: Payer: Self-pay | Admitting: Family Medicine

## 2016-09-04 ENCOUNTER — Other Ambulatory Visit: Payer: Self-pay | Admitting: Physician Assistant

## 2016-09-04 DIAGNOSIS — E7800 Pure hypercholesterolemia, unspecified: Secondary | ICD-10-CM

## 2016-09-04 NOTE — Telephone Encounter (Signed)
Pt states that someone called him I don't see where anyone had called him

## 2016-09-04 NOTE — Telephone Encounter (Signed)
Left message to schedule visit before this supply is exhausted.  Meds ordered this encounter  Medications  . pravastatin (PRAVACHOL) 40 MG tablet    Sig: TAKE 1 TABLET(40 MG) BY MOUTH DAILY    Dispense:  90 tablet    Refill:  0    Please notify patient that s/he needs an office visit +/- labsfor additional refills.

## 2016-11-12 ENCOUNTER — Other Ambulatory Visit: Payer: Self-pay | Admitting: Physician Assistant

## 2016-11-24 ENCOUNTER — Other Ambulatory Visit: Payer: Self-pay | Admitting: Urgent Care

## 2016-11-24 DIAGNOSIS — I10 Essential (primary) hypertension: Secondary | ICD-10-CM

## 2016-11-29 ENCOUNTER — Ambulatory Visit (INDEPENDENT_AMBULATORY_CARE_PROVIDER_SITE_OTHER): Payer: BLUE CROSS/BLUE SHIELD | Admitting: Family Medicine

## 2016-11-29 ENCOUNTER — Encounter: Payer: Self-pay | Admitting: Family Medicine

## 2016-11-29 ENCOUNTER — Other Ambulatory Visit: Payer: Self-pay | Admitting: Physician Assistant

## 2016-11-29 VITALS — BP 128/72 | HR 63 | Temp 98.0°F | Resp 18 | Ht 64.96 in | Wt 134.0 lb

## 2016-11-29 DIAGNOSIS — I10 Essential (primary) hypertension: Secondary | ICD-10-CM

## 2016-11-29 DIAGNOSIS — R011 Cardiac murmur, unspecified: Secondary | ICD-10-CM

## 2016-11-29 DIAGNOSIS — R351 Nocturia: Secondary | ICD-10-CM

## 2016-11-29 DIAGNOSIS — E785 Hyperlipidemia, unspecified: Secondary | ICD-10-CM | POA: Diagnosis not present

## 2016-11-29 DIAGNOSIS — E7800 Pure hypercholesterolemia, unspecified: Secondary | ICD-10-CM

## 2016-11-29 DIAGNOSIS — N401 Enlarged prostate with lower urinary tract symptoms: Secondary | ICD-10-CM

## 2016-11-29 MED ORDER — PRAVASTATIN SODIUM 40 MG PO TABS: ORAL_TABLET | ORAL | 1 refills | Status: DC

## 2016-11-29 MED ORDER — TAMSULOSIN HCL 0.4 MG PO CAPS: 0.4000 mg | ORAL_CAPSULE | Freq: Every day | ORAL | 1 refills | Status: DC

## 2016-11-29 MED ORDER — AMLODIPINE BESYLATE 5 MG PO TABS: ORAL_TABLET | ORAL | 1 refills | Status: DC

## 2016-11-29 NOTE — Progress Notes (Signed)
By signing my name below, I, [name], attest that this documentation has been prepared under the direction and in the presence of [name], MD.  Electronically Signed: [name], Medical Scribe. 11/29/16. 12:27 PM.  Subjective:    Patient ID: Patrick Abbott, male    DOB: 1950-11-07, 66 y.o.   MRN: [id number]  HPI Chief Complaint  Patient presents with  . Hypertension     follow up/refills  . Hyperlipidemia    HPI Comments: Patrick Abbott is a 66 y.o. male who presents to Primary Care at [facility] for follow-up. Reports he is on amlodipine, flomax, and pravastatin. Pt is no longer taking cardura or baby apsprin. Pt has never had a heart murmur that he knows of in the past. Denies FHx of heart disease.  HTN: He takes amlodipine 5 mg QD. Pt is complaint with amlodipine. Denies experiencing chest pain, dizziness, light-headedness, trouble breathing, abdominal pain, melena, or other negative side effects from his medication. Lab Results  Component Value Date   CREATININE 1.10 06/02/2016   BP Readings from Last 3 Encounters:  11/29/16 128/72  06/02/16 114/76  10/04/15 110/72   BPH: He is on flomax. Reports nocturia every hour to 3 hours if he drinks a lot of water at night. Pt gets thirsty at night. Mentions it's hard to start urinating after holding his urine. Pt is not followed by a urologist. Denies trouble urinating. Lab Results  Component Value Date   PSA 1.29 08/25/2015   PSA 0.74 12/23/2012   HLD: Complaint with pravastatin 40 mg QD.no new myalgias.  Lab Results  Component Value Date   CHOL 164 10/04/2015   HDL 44 10/04/2015   LDLCALC 88 10/04/2015   TRIG 161 (H) 10/04/2015   CHOLHDL 3.7 10/04/2015   Lab Results  Component Value Date   ALT 22 10/04/2015   AST 24 10/04/2015   ALKPHOS 51 10/04/2015   BILITOT 0.6 10/04/2015    Patient Active Problem List   Diagnosis Date Noted  . BPH (benign prostatic hypertrophy) 10/04/2015  . Benign essential HTN  09/16/2014  . Pure hypercholesterolemia 09/16/2014  . CTS (carpal tunnel syndrome) 02/18/2014   Past Medical History:  Diagnosis Date  . Depression   . Hyperlipidemia   . Hypertension    Past Surgical History:  Procedure Laterality Date  . HERNIA REPAIR     Left   No Known Allergies Prior to Admission medications   Medication Sig Start Date End Date Taking? Authorizing Provider  amLODipine (NORVASC) 5 MG tablet TAKE 1 TABLET(5 MG) BY MOUTH DAILY 11/26/16  Yes [name], PA-C  aspirin 325 MG tablet Take 325 mg by mouth daily. Reported on 10/04/2015   Yes [provider]  doxazosin (CARDURA) 1 MG tablet TAKE 1 TABLET(1 MG) BY MOUTH DAILY 02/28/16  Yes Weber, Sarah L, PA-C  pravastatin (PRAVACHOL) 40 MG tablet TAKE 1 TABLET(40 MG) BY MOUTH DAILY 09/04/16  Yes Jeffery, Chelle, PA-C  tamsulosin (FLOMAX) 0.4 MG CAPS capsule TAKE 1 CAPSULE BY MOUTH EVERY DAY 11/17/16  Yes [name], PA-C  VITAMIN D, CHOLECALCIFEROL, PO Take 1 tablet by mouth daily.   Yes [provider]   Social History   Social History  . Marital status: Married    Spouse name: [name]  . Number of children: [name]  . Years of education: [name]   Occupational History  . Not on file.   Social History Main Topics  . Smoking status: Never Smoker  . Smokeless tobacco: Never Used  .  Alcohol use No  . Drug use: No  . Sexual activity: Yes   Other Topics Concern  . Not on file   Social History Narrative   Marital status: married x 32 years; moved from [place] in 1982.      Children: 3 children; 1 grandchild.      Employment:  Water engineer.      Tobacco: none      Alcohol:  Once per month      Exercise:  Weights, walking.   Review of Systems  Constitutional: Negative for unexpected weight change.  Respiratory: Negative for cough, chest tightness and shortness of breath.   Cardiovascular: Negative for chest pain, palpitations and leg swelling.  Gastrointestinal: Negative for abdominal pain and  blood in stool.  Neurological: Negative for dizziness, light-headedness and headaches.   Objective:  Physical Exam  Constitutional: He is oriented to person, place, and time. He appears well-developed and well-nourished. No distress.  HENT:  Head: Normocephalic and atraumatic.  Eyes: Conjunctivae and EOM are normal. Pupils are equal, round, and reactive to light.  Neck: Neck supple.  Cardiovascular: Normal rate and regular rhythm.  Exam reveals no gallop and no friction rub.   Murmur heard.  Systolic (left chest wall) murmur is present with a grade of 3/6  Pulmonary/Chest: Effort normal and breath sounds normal. No respiratory distress. He has no wheezes. He has no rales.  Abdominal: There is no tenderness.  Musculoskeletal: He exhibits no edema.  Neurological: He is alert and oriented to person, place, and time.  Skin: Skin is warm and dry.  Psychiatric: He has a normal mood and affect. His behavior is normal.  Nursing note and vitals reviewed.   Vitals:   11/29/16 1154  BP: 128/72  Pulse: 63  Resp: 18  Temp: 98 F (36.7 C)  TempSrc: Oral  SpO2: 99%  Weight: 134 lb (60.8 kg)  Height: 5' 4.96" (1.65 m)  Body mass index is 22.33 kg/m.   [EKG Reading]: Sinus bradycardia, rate 54, without apparent changes from previous EKG July 2014. Assessment & Plan:   Patrick Abbott is a 66 y.o. male Essential hypertension - Plan: Comprehensive metabolic panel, EKG 78-EUMP, amLODipine (NORVASC) 5 MG tablet  - Stable, check labs, continue same dose of amlodipine.  Benign prostatic hyperplasia with nocturia - Plan: PSA, tamsulosin (FLOMAX) 0.4 MG CAPS capsule  - Still with some increased nocturia. This may be related to fluid intake later in the evening.   - Initial approach of decreasing fluid intake within an hour or 2 at bedtime, but to maintain hydration during the day  - continue Flomax same dose for now, check PSA  - If still persistent nocturia in next month, return for  discussion.  Heart murmur - Plan: ECHOCARDIOGRAM COMPLETE, EKG 12-Lead  - Grade 2 to 3 systolic murmur, does not appear that was heard prior. Asymptomatic. We'll check echocardiogram, then determine if cardiac follow-up needed.  Pure hypercholesterolemia - Plan: pravastatin (PRAVACHOL) 40 MG tablet Hyperlipidemia, unspecified hyperlipidemia type - Plan: Comprehensive metabolic panel, Lipid panel  - Tolerating Pravachol, continue same dose. labs pending  Meds ordered this encounter  Medications  . amLODipine (NORVASC) 5 MG tablet    Sig: TAKE 1 TABLET(5 MG) BY MOUTH DAILY    Dispense:  90 tablet    Refill:  1  . pravastatin (PRAVACHOL) 40 MG tablet    Sig: TAKE 1 TABLET(40 MG) BY MOUTH DAILY    Dispense:  90 tablet    Refill:  1  . tamsulosin (FLOMAX) 0.4 MG CAPS capsule    Sig: Take 1 capsule (0.4 mg total) by mouth daily.    Dispense:  90 capsule    Refill:  1   Patient Instructions   Drink plenty of fluids during the day, but decrease fluids within a few hours of bedtime. If you still have to urinate more than twice per night in next month, please return to discuss those symptoms further.   I did hear a slight heart murmur today. I will check an echocardiogram to see if there are any concerns, but it does not sound worrisome at this point.   No change in medications for now.    IF you received an x-ray today, you will receive an invoice from [facility]. Please contact [facility] at [phone] with questions or concerns regarding your invoice.   IF you received labwork today, you will receive an invoice from [place]. Please contact [facility] at [phone] with questions or concerns regarding your invoice.   Our billing staff will not be able to assist you with questions regarding bills from these companies.  You will be contacted with the lab results as soon as they are available. The fastest way to get your results is to activate your My Chart  account. Instructions are located on the last page of this paperwork. If you have not heard from [place] regarding the results in 2 weeks, please contact this office.       I personally performed the services described in this documentation, which was scribed in my presence. The recorded information has been reviewed and considered for accuracy and completeness, addended by me as needed, and agree with information above.  Signed,   [name], MD Primary [facility] at [email or web address].  11/29/16 1:53 PM

## 2016-11-29 NOTE — Patient Instructions (Addendum)
Drink plenty of fluids during the day, but decrease fluids within a few hours of bedtime. If you still have to urinate more than twice per night in next month, please return to discuss those symptoms further.   I did hear a slight heart murmur today. I will check an echocardiogram to see if there are any concerns, but it does not sound worrisome at this point.   No change in medications for now.    IF you received an x-ray today, you will receive an invoice from [facility]. Please contact [facility] at [phone] with questions or concerns regarding your invoice.   IF you received labwork today, you will receive an invoice from [place]. Please contact [facility] at [phone] with questions or concerns regarding your invoice.   Our billing staff will not be able to assist you with questions regarding bills from these companies.  You will be contacted with the lab results as soon as they are available. The fastest way to get your results is to activate your My Chart account. Instructions are located on the last page of this paperwork. If you have not heard from [place] regarding the results in 2 weeks, please contact this office.

## 2016-11-30 LAB — COMPREHENSIVE METABOLIC PANEL
ALT: 25 IU/L (ref 0–44)
AST: 25 IU/L (ref 0–40)
Albumin/Globulin Ratio: 1.6 (ref 1.2–2.2)
Albumin: 4.7 g/dL (ref 3.6–4.8)
Alkaline Phosphatase: 61 IU/L (ref 39–117)
BILIRUBIN TOTAL: 0.6 mg/dL (ref 0.0–1.2)
BUN/Creatinine Ratio: 13 (ref 10–24)
BUN: 13 mg/dL (ref 8–27)
CHLORIDE: 103 mmol/L (ref 96–106)
CO2: 24 mmol/L (ref 18–29)
CREATININE: 1.03 mg/dL (ref 0.76–1.27)
Calcium: 9.1 mg/dL (ref 8.6–10.2)
GFR calc non Af Amer: 75 mL/min/{1.73_m2} (ref >59)
GFR, EST AFRICAN AMERICAN: 87 mL/min/{1.73_m2} (ref >59)
GLUCOSE: 84 mg/dL (ref 65–99)
Globulin, Total: 2.9 g/dL (ref 1.5–4.5)
Potassium: 4.3 mmol/L (ref 3.5–5.2)
Sodium: 142 mmol/L (ref 134–144)
TOTAL PROTEIN: 7.6 g/dL (ref 6.0–8.5)

## 2016-11-30 LAB — LIPID PANEL
CHOLESTEROL TOTAL: 179 mg/dL (ref 100–199)
Chol/HDL Ratio: 3.5 ratio (ref 0.0–5.0)
HDL: 51 mg/dL (ref >39)
LDL Calculated: 107 mg/dL — ABNORMAL HIGH (ref 0–99)
Triglycerides: 103 mg/dL (ref 0–149)
VLDL CHOLESTEROL CAL: 21 mg/dL (ref 5–40)

## 2016-11-30 LAB — PSA: PROSTATE SPECIFIC AG, SERUM: 1.2 ng/mL (ref 0.0–4.0)

## 2016-12-10 ENCOUNTER — Encounter: Payer: Self-pay | Admitting: *Deleted

## 2017-01-08 ENCOUNTER — Other Ambulatory Visit: Payer: Self-pay

## 2017-01-08 ENCOUNTER — Ambulatory Visit (HOSPITAL_COMMUNITY): Payer: BLUE CROSS/BLUE SHIELD | Attending: Cardiology

## 2017-01-08 DIAGNOSIS — I081 Rheumatic disorders of both mitral and tricuspid valves: Secondary | ICD-10-CM | POA: Insufficient documentation

## 2017-01-08 DIAGNOSIS — R011 Cardiac murmur, unspecified: Secondary | ICD-10-CM

## 2017-02-11 ENCOUNTER — Other Ambulatory Visit: Payer: Self-pay | Admitting: Urgent Care

## 2017-02-18 ENCOUNTER — Other Ambulatory Visit: Payer: Self-pay | Admitting: Urgent Care

## 2017-02-18 DIAGNOSIS — I10 Essential (primary) hypertension: Secondary | ICD-10-CM

## 2017-05-19 ENCOUNTER — Other Ambulatory Visit: Payer: Self-pay | Admitting: Family Medicine

## 2017-05-19 DIAGNOSIS — E7800 Pure hypercholesterolemia, unspecified: Secondary | ICD-10-CM

## 2017-06-12 ENCOUNTER — Ambulatory Visit (INDEPENDENT_AMBULATORY_CARE_PROVIDER_SITE_OTHER): Payer: BLUE CROSS/BLUE SHIELD | Admitting: Emergency Medicine

## 2017-06-12 ENCOUNTER — Encounter: Payer: Self-pay | Admitting: Emergency Medicine

## 2017-06-12 ENCOUNTER — Other Ambulatory Visit: Payer: Self-pay

## 2017-06-12 VITALS — BP 130/78 | HR 87 | Temp 97.8°F | Resp 16 | Ht 64.96 in | Wt 141.0 lb

## 2017-06-12 DIAGNOSIS — E7800 Pure hypercholesterolemia, unspecified: Secondary | ICD-10-CM | POA: Diagnosis not present

## 2017-06-12 DIAGNOSIS — N401 Enlarged prostate with lower urinary tract symptoms: Secondary | ICD-10-CM | POA: Diagnosis not present

## 2017-06-12 DIAGNOSIS — Z1211 Encounter for screening for malignant neoplasm of colon: Secondary | ICD-10-CM

## 2017-06-12 DIAGNOSIS — Z23 Encounter for immunization: Secondary | ICD-10-CM

## 2017-06-12 DIAGNOSIS — R351 Nocturia: Secondary | ICD-10-CM

## 2017-06-12 DIAGNOSIS — I10 Essential (primary) hypertension: Secondary | ICD-10-CM

## 2017-06-12 MED ORDER — AMLODIPINE BESYLATE 5 MG PO TABS: ORAL_TABLET | ORAL | 1 refills | Status: DC

## 2017-06-12 MED ORDER — PRAVASTATIN SODIUM 40 MG PO TABS: 40.0000 mg | ORAL_TABLET | Freq: Every day | ORAL | 1 refills | Status: DC

## 2017-06-12 MED ORDER — TAMSULOSIN HCL 0.4 MG PO CAPS: 0.4000 mg | ORAL_CAPSULE | Freq: Every day | ORAL | 1 refills | Status: DC

## 2017-06-12 NOTE — Patient Instructions (Addendum)
   IF you received an x-ray today, you will receive an invoice from [facility]. Please contact [facility] at [phone] with questions or concerns regarding your invoice.   IF you received labwork today, you will receive an invoice from [facility]. Please contact [facility] at [phone] with questions or concerns regarding your invoice.   Our billing staff will not be able to assist you with questions regarding bills from these companies.  You will be contacted with the lab results as soon as they are available. The fastest way to get your results is to activate your My Chart account. Instructions are located on the last page of this paperwork. If you have not heard from us regarding the results in 2 weeks, please contact this office.     Hypertension Hypertension, commonly called high blood pressure, is when the force of blood pumping through the arteries is too strong. The arteries are the blood vessels that carry blood from the heart throughout the body. Hypertension forces the heart to work harder to pump blood and may cause arteries to become narrow or stiff. Having untreated or uncontrolled hypertension can cause heart attacks, strokes, kidney disease, and other problems. A blood pressure reading consists of a higher number over a lower number. Ideally, your blood pressure should be below 120/80. The first ("top") number is called the systolic pressure. It is a measure of the pressure in your arteries as your heart beats. The second ("bottom") number is called the diastolic pressure. It is a measure of the pressure in your arteries as the heart relaxes. What are the causes? The cause of this condition is not known. What increases the risk? Some risk factors for high blood pressure are under your control. Others are not. Factors you can change  Smoking.  Having type 2 diabetes mellitus, high cholesterol, or both.  Not getting enough exercise or physical  activity.  Being overweight.  Having too much fat, sugar, calories, or salt (sodium) in your diet.  Drinking too much alcohol. Factors that are difficult or impossible to change  Having chronic kidney disease.  Having a family history of high blood pressure.  Age. Risk increases with age.  Race. You may be at higher risk if you are African-American.  Gender. Men are at higher risk than women before age 45. After age 65, women are at higher risk than men.  Having obstructive sleep apnea.  Stress. What are the signs or symptoms? Extremely high blood pressure (hypertensive crisis) may cause:  Headache.  Anxiety.  Shortness of breath.  Nosebleed.  Nausea and vomiting.  Severe chest pain.  Jerky movements you cannot control (seizures).  How is this diagnosed? This condition is diagnosed by measuring your blood pressure while you are seated, with your arm resting on a surface. The cuff of the blood pressure monitor will be placed directly against the skin of your upper arm at the level of your heart. It should be measured at least twice using the same arm. Certain conditions can cause a difference in blood pressure between your right and left arms. Certain factors can cause blood pressure readings to be lower or higher than normal (elevated) for a short period of time:  When your blood pressure is higher when you are in a health care provider's office than when you are at home, this is called white coat hypertension. Most people with this condition do not need medicines.  When your blood pressure is higher at home than when you   are in a health care provider's office, this is called masked hypertension. Most people with this condition may need medicines to control blood pressure.  If you have a high blood pressure reading during one visit or you have normal blood pressure with other risk factors:  You may be asked to return on a different day to have your blood pressure  checked again.  You may be asked to monitor your blood pressure at home for 1 week or longer.  If you are diagnosed with hypertension, you may have other blood or imaging tests to help your health care provider understand your overall risk for other conditions. How is this treated? This condition is treated by making healthy lifestyle changes, such as eating healthy foods, exercising more, and reducing your alcohol intake. Your health care provider may prescribe medicine if lifestyle changes are not enough to get your blood pressure under control, and if:  Your systolic blood pressure is above 130.  Your diastolic blood pressure is above 80.  Your personal target blood pressure may vary depending on your medical conditions, your age, and other factors. Follow these instructions at home: Eating and drinking  Eat a diet that is high in fiber and potassium, and low in sodium, added sugar, and fat. An example eating plan is called the DASH (Dietary Approaches to Stop Hypertension) diet. To eat this way: ? Eat plenty of fresh fruits and vegetables. Try to fill half of your plate at each meal with fruits and vegetables. ? Eat whole grains, such as whole wheat pasta, brown rice, or whole grain bread. Fill about one quarter of your plate with whole grains. ? Eat or drink low-fat dairy products, such as skim milk or low-fat yogurt. ? Avoid fatty cuts of meat, processed or cured meats, and poultry with skin. Fill about one quarter of your plate with lean proteins, such as fish, chicken without skin, beans, eggs, and tofu. ? Avoid premade and processed foods. These tend to be higher in sodium, added sugar, and fat.  Reduce your daily sodium intake. Most people with hypertension should eat less than 1,500 mg of sodium a day.  Limit alcohol intake to no more than 1 drink a day for nonpregnant women and 2 drinks a day for men. One drink equals 12 oz of beer, 5 oz of wine, or 1 oz of hard  liquor. Lifestyle  Work with your health care provider to maintain a healthy body weight or to lose weight. Ask what an ideal weight is for you.  Get at least 30 minutes of exercise that causes your heart to beat faster (aerobic exercise) most days of the week. Activities may include walking, swimming, or biking.  Include exercise to strengthen your muscles (resistance exercise), such as pilates or lifting weights, as part of your weekly exercise routine. Try to do these types of exercises for 30 minutes at least 3 days a week.  Do not use any products that contain nicotine or tobacco, such as cigarettes and e-cigarettes. If you need help quitting, ask your health care provider.  Monitor your blood pressure at home as told by your health care provider.  Keep all follow-up visits as told by your health care provider. This is important. Medicines  Take over-the-counter and prescription medicines only as told by your health care provider. Follow directions carefully. Blood pressure medicines must be taken as prescribed.  Do not skip doses of blood pressure medicine. Doing this puts you at risk for problems and   can make the medicine less effective.  Ask your health care provider about side effects or reactions to medicines that you should watch for. Contact a health care provider if:  You think you are having a reaction to a medicine you are taking.  You have headaches that keep coming back (recurring).  You feel dizzy.  You have swelling in your ankles.  You have trouble with your vision. Get help right away if:  You develop a severe headache or confusion.  You have unusual weakness or numbness.  You feel faint.  You have severe pain in your chest or abdomen.  You vomit repeatedly.  You have trouble breathing. Summary  Hypertension is when the force of blood pumping through your arteries is too strong. If this condition is not controlled, it may put you at risk for serious  complications.  Your personal target blood pressure may vary depending on your medical conditions, your age, and other factors. For most people, a normal blood pressure is less than 120/80.  Hypertension is treated with lifestyle changes, medicines, or a combination of both. Lifestyle changes include weight loss, eating a healthy, low-sodium diet, exercising more, and limiting alcohol. This information is not intended to replace advice given to you by your health care provider. Make sure you discuss any questions you have with your health care provider. Document Released: 06/09/2005 Document Revised: 05/07/2016 Document Reviewed: 05/07/2016 Elsevier Interactive Patient Education  2018 Elsevier Inc.  

## 2017-06-12 NOTE — Progress Notes (Signed)
Franne Grip 66 y.o.   Chief Complaint  Patient presents with  . f/u medication and blood pressure    needs refills on amlodipine, pravachol and flomax    HISTORY OF PRESENT ILLNESS: This is a 66 y.o. male here for follow up of HTN; needs medication refills. Asymptomatic; no complaints or medical concerns.  HPI   Prior to Admission medications   Medication Sig Start Date End Date Taking? Authorizing Provider  amLODipine (NORVASC) 5 MG tablet TAKE 1 TABLET(5 MG) BY MOUTH DAILY 02/19/17  Yes Wendie Agreste, MD  pravastatin (PRAVACHOL) 40 MG tablet TAKE 1 TABLET(40 MG) BY MOUTH DAILY 05/20/17  Yes Wendie Agreste, MD  tamsulosin (FLOMAX) 0.4 MG CAPS capsule Take 1 capsule (0.4 mg total) by mouth daily. 11/29/16  Yes Wendie Agreste, MD  aspirin 325 MG tablet Take 325 mg by mouth daily. Reported on 10/04/2015    [provider]  tamsulosin (FLOMAX) 0.4 MG CAPS capsule TAKE 1 CAPSULE BY MOUTH EVERY DAY Patient not taking: Reported on 06/12/2017 02/11/17   Mancel Bale, PA-C  VITAMIN D, CHOLECALCIFEROL, PO Take 1 tablet by mouth daily.    [provider]    No Known Allergies  Patient Active Problem List   Diagnosis Date Noted  . BPH (benign prostatic hypertrophy) 10/04/2015  . Benign essential HTN 09/16/2014  . Pure hypercholesterolemia 09/16/2014  . CTS (carpal tunnel syndrome) 02/18/2014    Past Medical History:  Diagnosis Date  . Depression   . Hyperlipidemia   . Hypertension     Past Surgical History:  Procedure Laterality Date  . HERNIA REPAIR     Left    Social History   Socioeconomic History  . Marital status: Married    Spouse name: Not on file  . Number of children: Not on file  . Years of education: Not on file  . Highest education level: Not on file  Social Needs  . Financial resource strain: Not on file  . Food insecurity - worry: Not on file  . Food insecurity - inability: Not on file  . Transportation needs - medical: Not on  file  . Transportation needs - non-medical: Not on file  Occupational History  . Not on file  Tobacco Use  . Smoking status: Never Smoker  . Smokeless tobacco: Never Used  Substance and Sexual Activity  . Alcohol use: No  . Drug use: No  . Sexual activity: Yes  Other Topics Concern  . Not on file  Social History Narrative   Marital status: married x 32 years; moved from [place] in 1982.      Children: 3 children; 1 grandchild.      Employment:  Water engineer.      Tobacco: none      Alcohol:  Once per month      Exercise:  Weights, walking.    No family history on file.   Review of Systems  Constitutional: Negative.  Negative for chills, fever and weight loss.  HENT: Negative.   Eyes: Negative.   Respiratory: Negative.  Negative for cough and shortness of breath.   Cardiovascular: Negative for chest pain, palpitations, orthopnea, claudication and leg swelling.  Gastrointestinal: Negative.  Negative for abdominal pain, diarrhea, nausea and vomiting.  Genitourinary: Negative.  Negative for dysuria and hematuria.  Musculoskeletal: Negative.  Negative for back pain, myalgias and neck pain.  Skin: Negative.  Negative for rash.  Neurological: Negative for dizziness, sensory change, focal weakness and headaches.  Endo/Heme/Allergies: Negative.   All other systems reviewed and are negative.   Vitals:   06/12/17 1410  BP: 130/78  Pulse: 87  Resp: 16  Temp: 97.8 F (36.6 C)  SpO2: 98%    Physical Exam  Constitutional: He is oriented to person, place, and time. He appears well-developed and well-nourished.  HENT:  Head: Normocephalic and atraumatic.  Right Ear: External ear normal.  Left Ear: External ear normal.  Nose: Nose normal.  Mouth/Throat: Oropharynx is clear and moist.  Eyes: Conjunctivae and EOM are normal. Pupils are equal, round, and reactive to light.  Neck: Normal range of motion. Neck supple. No JVD present. No thyromegaly present.  Cardiovascular:  Normal rate, regular rhythm and normal heart sounds.  Pulmonary/Chest: Effort normal and breath sounds normal.  Abdominal: Soft. Bowel sounds are normal. He exhibits no distension. There is no tenderness.  Musculoskeletal: Normal range of motion.  Lymphadenopathy:    He has no cervical adenopathy.  Neurological: He is alert and oriented to person, place, and time. No sensory deficit. He exhibits normal muscle tone.  Skin: Skin is warm and dry. Capillary refill takes less than 2 seconds. No rash noted.  Psychiatric: He has a normal mood and affect. His behavior is normal.  Vitals reviewed.  A total of 25 minutes  was spent in the room with the patient, greater than 50% of which was in counseling/coordination of care regarding chronic medical problems..   ASSESSMENT & PLAN: [place] was seen today for f/u medication and blood pressure.  Diagnoses and all orders for this visit:  Need for influenza vaccination -     Flu Vaccine QUAD 36+ mos IM  Essential hypertension -     amLODipine (NORVASC) 5 MG tablet; TAKE 1 TABLET(5 MG) BY MOUTH DAILY -     Comprehensive metabolic panel -     CBC with Differential/Platelet -     Lipid panel  Pure hypercholesterolemia -     pravastatin (PRAVACHOL) 40 MG tablet; Take 1 tablet (40 mg total) by mouth daily. -     Lipid panel  Benign prostatic hyperplasia with nocturia -     tamsulosin (FLOMAX) 0.4 MG CAPS capsule; Take 1 capsule (0.4 mg total) by mouth daily.  Colon cancer screening -     Ambulatory referral to Gastroenterology    Patient Instructions       IF you received an x-ray today, you will receive an invoice from [facility]. Please contact [facility] at [phone] with questions or concerns regarding your invoice.   IF you received labwork today, you will receive an invoice from [facility]. Please contact [facility] at [phone] with questions or concerns regarding your invoice.   Our billing staff will not be  able to assist you with questions regarding bills from these companies.  You will be contacted with the lab results as soon as they are available. The fastest way to get your results is to activate your My Chart account. Instructions are located on the last page of this paperwork. If you have not heard from [place] regarding the results in 2 weeks, please contact this office.     Hypertension Hypertension, commonly called high blood pressure, is when the force of blood pumping through the arteries is too strong. The arteries are the blood vessels that carry blood from the heart throughout the body. Hypertension forces the heart to work harder to pump blood and may cause arteries to become narrow or stiff. Having untreated or uncontrolled hypertension  can cause heart attacks, strokes, kidney disease, and other problems. A blood pressure reading consists of a higher number over a lower number. Ideally, your blood pressure should be below 120/80. The first ("top") number is called the systolic pressure. It is a measure of the pressure in your arteries as your heart beats. The second ("bottom") number is called the diastolic pressure. It is a measure of the pressure in your arteries as the heart relaxes. What are the causes? The cause of this condition is not known. What increases the risk? Some risk factors for high blood pressure are under your control. Others are not. Factors you can change  Smoking.  Having type 2 diabetes mellitus, high cholesterol, or both.  Not getting enough exercise or physical activity.  Being overweight.  Having too much fat, sugar, calories, or salt (sodium) in your diet.  Drinking too much alcohol. Factors that are difficult or impossible to change  Having chronic kidney disease.  Having a family history of high blood pressure.  Age. Risk increases with age.  Race. You may be at higher risk if you are African-American.  Gender. Men are at higher risk than  women before age 31. After age 48, women are at higher risk than men.  Having obstructive sleep apnea.  Stress. What are the signs or symptoms? Extremely high blood pressure (hypertensive crisis) may cause:  Headache.  Anxiety.  Shortness of breath.  Nosebleed.  Nausea and vomiting.  Severe chest pain.  Jerky movements you cannot control (seizures).  How is this diagnosed? This condition is diagnosed by measuring your blood pressure while you are seated, with your arm resting on a surface. The cuff of the blood pressure monitor will be placed directly against the skin of your upper arm at the level of your heart. It should be measured at least twice using the same arm. Certain conditions can cause a difference in blood pressure between your right and left arms. Certain factors can cause blood pressure readings to be lower or higher than normal (elevated) for a short period of time:  When your blood pressure is higher when you are in a health care provider's office than when you are at home, this is called white coat hypertension. Most people with this condition do not need medicines.  When your blood pressure is higher at home than when you are in a health care provider's office, this is called masked hypertension. Most people with this condition may need medicines to control blood pressure.  If you have a high blood pressure reading during one visit or you have normal blood pressure with other risk factors:  You may be asked to return on a different day to have your blood pressure checked again.  You may be asked to monitor your blood pressure at home for 1 week or longer.  If you are diagnosed with hypertension, you may have other blood or imaging tests to help your health care provider understand your overall risk for other conditions. How is this treated? This condition is treated by making healthy lifestyle changes, such as eating healthy foods, exercising more, and  reducing your alcohol intake. Your health care provider may prescribe medicine if lifestyle changes are not enough to get your blood pressure under control, and if:  Your systolic blood pressure is above 130.  Your diastolic blood pressure is above 80.  Your personal target blood pressure may vary depending on your medical conditions, your age, and other factors. Follow these instructions  at home: Eating and drinking  Eat a diet that is high in fiber and potassium, and low in sodium, added sugar, and fat. An example eating plan is called the DASH (Dietary Approaches to Stop Hypertension) diet. To eat this way: ? Eat plenty of fresh fruits and vegetables. Try to fill half of your plate at each meal with fruits and vegetables. ? Eat whole grains, such as whole wheat pasta, brown rice, or whole grain bread. Fill about one quarter of your plate with whole grains. ? Eat or drink low-fat dairy products, such as skim milk or low-fat yogurt. ? Avoid fatty cuts of meat, processed or cured meats, and poultry with skin. Fill about one quarter of your plate with lean proteins, such as fish, chicken without skin, beans, eggs, and tofu. ? Avoid premade and processed foods. These tend to be higher in sodium, added sugar, and fat.  Reduce your daily sodium intake. Most people with hypertension should eat less than 1,500 mg of sodium a day.  Limit alcohol intake to no more than 1 drink a day for nonpregnant women and 2 drinks a day for men. One drink equals 12 oz of beer, 5 oz of wine, or 1 oz of hard liquor. Lifestyle  Work with your health care provider to maintain a healthy body weight or to lose weight. Ask what an ideal weight is for you.  Get at least 30 minutes of exercise that causes your heart to beat faster (aerobic exercise) most days of the week. Activities may include walking, swimming, or biking.  Include exercise to strengthen your muscles (resistance exercise), such as pilates or lifting  weights, as part of your weekly exercise routine. Try to do these types of exercises for 30 minutes at least 3 days a week.  Do not use any products that contain nicotine or tobacco, such as cigarettes and e-cigarettes. If you need help quitting, ask your health care provider.  Monitor your blood pressure at home as told by your health care provider.  Keep all follow-up visits as told by your health care provider. This is important. Medicines  Take over-the-counter and prescription medicines only as told by your health care provider. Follow directions carefully. Blood pressure medicines must be taken as prescribed.  Do not skip doses of blood pressure medicine. Doing this puts you at risk for problems and can make the medicine less effective.  Ask your health care provider about side effects or reactions to medicines that you should watch for. Contact a health care provider if:  You think you are having a reaction to a medicine you are taking.  You have headaches that keep coming back (recurring).  You feel dizzy.  You have swelling in your ankles.  You have trouble with your vision. Get help right away if:  You develop a severe headache or confusion.  You have unusual weakness or numbness.  You feel faint.  You have severe pain in your chest or abdomen.  You vomit repeatedly.  You have trouble breathing. Summary  Hypertension is when the force of blood pumping through your arteries is too strong. If this condition is not controlled, it may put you at risk for serious complications.  Your personal target blood pressure may vary depending on your medical conditions, your age, and other factors. For most people, a normal blood pressure is less than 120/80.  Hypertension is treated with lifestyle changes, medicines, or a combination of both. Lifestyle changes include weight loss, eating a  healthy, low-sodium diet, exercising more, and limiting alcohol. This information is  not intended to replace advice given to you by your health care provider. Make sure you discuss any questions you have with your health care provider. Document Released: 06/09/2005 Document Revised: 05/07/2016 Document Reviewed: 05/07/2016 Elsevier Interactive Patient Education  2018 Elsevier Inc.      Agustina Caroli, MD [facility]

## 2017-06-13 LAB — LIPID PANEL
CHOLESTEROL TOTAL: 232 mg/dL — AB (ref 100–199)
Chol/HDL Ratio: 4.9 ratio (ref 0.0–5.0)
HDL: 47 mg/dL (ref >39)
LDL Calculated: 142 mg/dL — ABNORMAL HIGH (ref 0–99)
TRIGLYCERIDES: 214 mg/dL — AB (ref 0–149)
VLDL CHOLESTEROL CAL: 43 mg/dL — AB (ref 5–40)

## 2017-06-13 LAB — CBC WITH DIFFERENTIAL/PLATELET
BASOS ABS: 0 10*3/uL (ref 0.0–0.2)
Basos: 0 %
EOS (ABSOLUTE): 0.1 10*3/uL (ref 0.0–0.4)
Eos: 2 %
Hematocrit: 39.8 % (ref 37.5–51.0)
Hemoglobin: 13.1 g/dL (ref 13.0–17.7)
IMMATURE GRANS (ABS): 0 10*3/uL (ref 0.0–0.1)
IMMATURE GRANULOCYTES: 0 %
LYMPHS: 31 %
Lymphocytes Absolute: 2.2 10*3/uL (ref 0.7–3.1)
MCH: 26 pg — AB (ref 26.6–33.0)
MCHC: 32.9 g/dL (ref 31.5–35.7)
MCV: 79 fL (ref 79–97)
Monocytes Absolute: 0.2 10*3/uL (ref 0.1–0.9)
Monocytes: 3 %
NEUTROS ABS: 4.5 10*3/uL (ref 1.4–7.0)
NEUTROS PCT: 64 %
PLATELETS: 276 10*3/uL (ref 150–379)
RBC: 5.03 x10E6/uL (ref 4.14–5.80)
RDW: 16.3 % — ABNORMAL HIGH (ref 12.3–15.4)
WBC: 7 10*3/uL (ref 3.4–10.8)

## 2017-06-13 LAB — COMPREHENSIVE METABOLIC PANEL
A/G RATIO: 1.4 (ref 1.2–2.2)
ALK PHOS: 66 IU/L (ref 39–117)
ALT: 19 IU/L (ref 0–44)
AST: 19 IU/L (ref 0–40)
Albumin: 4.1 g/dL (ref 3.6–4.8)
BILIRUBIN TOTAL: 0.4 mg/dL (ref 0.0–1.2)
BUN/Creatinine Ratio: 13 (ref 10–24)
BUN: 16 mg/dL (ref 8–27)
CHLORIDE: 102 mmol/L (ref 96–106)
CO2: 25 mmol/L (ref 20–29)
Calcium: 9.2 mg/dL (ref 8.6–10.2)
Creatinine, Ser: 1.21 mg/dL (ref 0.76–1.27)
GFR calc Af Amer: 72 mL/min/{1.73_m2} (ref >59)
GFR calc non Af Amer: 62 mL/min/{1.73_m2} (ref >59)
GLUCOSE: 120 mg/dL — AB (ref 65–99)
Globulin, Total: 3 g/dL (ref 1.5–4.5)
POTASSIUM: 4.9 mmol/L (ref 3.5–5.2)
Sodium: 140 mmol/L (ref 134–144)
Total Protein: 7.1 g/dL (ref 6.0–8.5)

## 2017-08-26 ENCOUNTER — Encounter: Payer: Self-pay | Admitting: Emergency Medicine

## 2018-02-07 ENCOUNTER — Other Ambulatory Visit: Payer: Self-pay | Admitting: Emergency Medicine

## 2018-02-07 DIAGNOSIS — I10 Essential (primary) hypertension: Secondary | ICD-10-CM

## 2018-02-07 DIAGNOSIS — E7800 Pure hypercholesterolemia, unspecified: Secondary | ICD-10-CM

## 2018-04-19 DIAGNOSIS — H524 Presbyopia: Secondary | ICD-10-CM | POA: Diagnosis not present

## 2018-05-07 ENCOUNTER — Other Ambulatory Visit: Payer: Self-pay | Admitting: Emergency Medicine

## 2018-05-07 DIAGNOSIS — E7800 Pure hypercholesterolemia, unspecified: Secondary | ICD-10-CM

## 2018-05-07 DIAGNOSIS — R351 Nocturia: Secondary | ICD-10-CM

## 2018-05-07 DIAGNOSIS — N401 Enlarged prostate with lower urinary tract symptoms: Secondary | ICD-10-CM

## 2018-05-07 DIAGNOSIS — I10 Essential (primary) hypertension: Secondary | ICD-10-CM

## 2018-05-07 MED ORDER — TAMSULOSIN HCL 0.4 MG PO CAPS: 0.4000 mg | ORAL_CAPSULE | Freq: Every day | ORAL | 1 refills | Status: DC

## 2018-05-07 MED ORDER — PRAVASTATIN SODIUM 40 MG PO TABS: ORAL_TABLET | ORAL | 3 refills | Status: DC

## 2018-05-07 MED ORDER — AMLODIPINE BESYLATE 5 MG PO TABS: ORAL_TABLET | ORAL | 3 refills | Status: DC

## 2018-06-23 HISTORY — PX: COLONOSCOPY: SHX174

## 2018-08-20 ENCOUNTER — Ambulatory Visit: Payer: Self-pay | Admitting: Family Medicine

## 2018-08-27 ENCOUNTER — Other Ambulatory Visit: Payer: Self-pay

## 2018-08-27 ENCOUNTER — Encounter: Payer: Self-pay | Admitting: Family Medicine

## 2018-08-27 ENCOUNTER — Ambulatory Visit (INDEPENDENT_AMBULATORY_CARE_PROVIDER_SITE_OTHER): Payer: Medicare HMO | Admitting: Family Medicine

## 2018-08-27 VITALS — BP 130/81 | HR 65 | Temp 98.3°F | Resp 16 | Ht 64.0 in | Wt 138.2 lb

## 2018-08-27 DIAGNOSIS — E785 Hyperlipidemia, unspecified: Secondary | ICD-10-CM

## 2018-08-27 DIAGNOSIS — E7800 Pure hypercholesterolemia, unspecified: Secondary | ICD-10-CM | POA: Diagnosis not present

## 2018-08-27 DIAGNOSIS — Z1211 Encounter for screening for malignant neoplasm of colon: Secondary | ICD-10-CM

## 2018-08-27 DIAGNOSIS — Z23 Encounter for immunization: Secondary | ICD-10-CM | POA: Diagnosis not present

## 2018-08-27 DIAGNOSIS — I10 Essential (primary) hypertension: Secondary | ICD-10-CM | POA: Diagnosis not present

## 2018-08-27 MED ORDER — PRAVASTATIN SODIUM 40 MG PO TABS: ORAL_TABLET | ORAL | 3 refills | Status: DC

## 2018-08-27 MED ORDER — AMLODIPINE BESYLATE 5 MG PO TABS: ORAL_TABLET | ORAL | 3 refills | Status: DC

## 2018-08-27 NOTE — Patient Instructions (Addendum)
  Thank you for coming in today.  No change in medications at this time.  Please schedule wellness exam/physical in the next 6 months.  We will refer you to gastroenterology for colonoscopy.  Pneumonia and flu vaccines were given today.   If you have lab work done today you will be contacted with your lab results within the next 2 weeks.  If you have not heard from [place] then please contact us. The fastest way to get your results is to register for My Chart.   IF you received an x-ray today, you will receive an invoice from [facility]. Please contact [facility] at [phone] with questions or concerns regarding your invoice.   IF you received labwork today, you will receive an invoice from [place]. Please contact [facility] at [phone] with questions or concerns regarding your invoice.   Our billing staff will not be able to assist you with questions regarding bills from these companies.  You will be contacted with the lab results as soon as they are available. The fastest way to get your results is to activate your My Chart account. Instructions are located on the last page of this paperwork. If you have not heard from [place] regarding the results in 2 weeks, please contact this office.

## 2018-08-27 NOTE — Progress Notes (Signed)
Subjective:    Patient ID: Patrick Abbott, male    DOB: 04-18-1951, 68 y.o.   MRN: [id number]  HPI Patrick Abbott is a 68 y.o. male Presents today for: Chief Complaint  Patient presents with  . Hypertension    bp has been running good at home. In the office today bp was 137/81  . Hyperlipidemia    patient is not fasting today. Patient last seen here 06/12/2017   Hypertension: BP Readings from Last 3 Encounters:  08/27/18 130/81  06/12/17 130/78  11/29/16 128/72   Lab Results  Component Value Date   CREATININE 1.21 06/12/2017  norvasc 5mg  qd. No new side effects.  Home BP readings variable. Under 130/under 90  Hyperlipidemia:  Lab Results  Component Value Date   CHOL 232 (H) 06/12/2017   HDL 47 06/12/2017   LDLCALC 142 (H) 06/12/2017   TRIG 214 (H) 06/12/2017   CHOLHDL 4.9 06/12/2017   Lab Results  Component Value Date   ALT 19 06/12/2017   AST 19 06/12/2017   ALKPHOS 66 06/12/2017   BILITOT 0.4 06/12/2017   pravachol 40mg  qd. No new side effects.   Longstanding left hernia  - doing ok - plans on continuing hernia belt.  HM:  Due for colonoscopy - agrees to referral.  Negative FOBT in 2017.  Pneumonia and flu vaccines given.   Patient Active Problem List   Diagnosis Date Noted  . Benign prostatic hyperplasia with nocturia 10/04/2015  . Essential hypertension 09/16/2014  . Pure hypercholesterolemia 09/16/2014  . CTS (carpal tunnel syndrome) 02/18/2014   Past Medical History:  Diagnosis Date  . Depression   . Hyperlipidemia   . Hypertension    Past Surgical History:  Procedure Laterality Date  . HERNIA REPAIR     Left   No Known Allergies Prior to Admission medications   Medication Sig Start Date End Date Taking? Authorizing Provider  amLODipine (NORVASC) 5 MG tablet TAKE 1 TABLET(5 MG) BY MOUTH DAILY 05/07/18  Yes Sagardia, Ines Bloomer, MD  pravastatin (PRAVACHOL) 40 MG tablet TAKE 1 TABLET(40 MG) BY MOUTH DAILY 05/07/18  Yes Sagardia, Ines Bloomer, MD    tamsulosin (FLOMAX) 0.4 MG CAPS capsule Take 1 capsule (0.4 mg total) by mouth daily. 05/07/18  Yes Sagardia, Ines Bloomer, MD  VITAMIN D, CHOLECALCIFEROL, PO Take 1 tablet by mouth daily.   Yes [provider]  aspirin 325 MG tablet Take 325 mg by mouth daily. Reported on 10/04/2015    [provider]   Social History   Socioeconomic History  . Marital status: Married    Spouse name: Not on file  . Number of children: Not on file  . Years of education: Not on file  . Highest education level: Not on file  Occupational History  . Not on file  Social Needs  . Financial resource strain: Not on file  . Food insecurity:    Worry: Not on file    Inability: Not on file  . Transportation needs:    Medical: Not on file    Non-medical: Not on file  Tobacco Use  . Smoking status: Never Smoker  . Smokeless tobacco: Never Used  Substance and Sexual Activity  . Alcohol use: No  . Drug use: No  . Sexual activity: Yes  Lifestyle  . Physical activity:    Days per week: Not on file    Minutes per session: Not on file  . Stress: Not on file  Relationships  . Social connections:  Talks on phone: Not on file    Gets together: Not on file    Attends religious service: Not on file    Active member of club or organization: Not on file    Attends meetings of clubs or organizations: Not on file    Relationship status: Not on file  . Intimate partner violence:    Fear of current or ex partner: Not on file    Emotionally abused: Not on file    Physically abused: Not on file    Forced sexual activity: Not on file  Other Topics Concern  . Not on file  Social History Narrative   Marital status: married x 32 years; moved from [place] in 1982.      Children: 3 children; 1 grandchild.      Employment:  Water engineer.      Tobacco: none      Alcohol:  Once per month      Exercise:  Weights, walking.    Review of Systems Per HPI.     Objective:   Physical  Exam Vitals signs reviewed.  Constitutional:      Appearance: He is well-developed.  HENT:     Head: Normocephalic and atraumatic.  Eyes:     Pupils: Pupils are equal, round, and reactive to light.  Neck:     Vascular: No carotid bruit or JVD.  Cardiovascular:     Rate and Rhythm: Normal rate and regular rhythm.     Heart sounds: Normal heart sounds. No murmur.  Pulmonary:     Effort: Pulmonary effort is normal.     Breath sounds: Normal breath sounds. No rales.  Skin:    General: Skin is warm and dry.  Neurological:     Mental Status: He is alert and oriented to person, place, and time.    Vitals:   08/27/18 1605  BP: 130/81  Pulse: 65  Resp: 16  Temp: 98.3 F (36.8 C)  TempSrc: Oral  SpO2: 100%  Weight: 138 lb 3.2 oz (62.7 kg)  Height: 5\' 4"  (1.626 m)        Assessment & Plan:   Patrick Abbott is a 68 y.o. male Essential hypertension - Plan: Comprehensive metabolic panel, amLODipine (NORVASC) 5 MG tablet, DISCONTINUED: amLODipine (NORVASC) 5 MG tablet  - Stable, tolerating current regimen. Medications refilled. Labs pending as above.   Special screening for malignant neoplasms, colon - Plan: Ambulatory referral to Gastroenterology  Need for prophylactic vaccination against Streptococcus pneumoniae (pneumococcus) - Plan: Pneumococcal polysaccharide vaccine 23-valent greater than or equal to 2yo subcutaneous/IM  Need for prophylactic vaccination and inoculation against influenza - Plan: Flu vaccine HIGH DOSE PF (Fluzone High dose)  Hyperlipidemia, unspecified hyperlipidemia type - Plan: Lipid Panel Pure hypercholesterolemia - Plan: pravastatin (PRAVACHOL) 40 MG tablet, DISCONTINUED: pravastatin (PRAVACHOL) 40 MG tablet  -  Stable, tolerating current regimen. Medications refilled. Labs pending as above.    Meds ordered this encounter  Medications  . DISCONTD: pravastatin (PRAVACHOL) 40 MG tablet    Sig: TAKE 1 TABLET(40 MG) BY MOUTH DAILY    Dispense:  90 tablet     Refill:  3  . DISCONTD: amLODipine (NORVASC) 5 MG tablet    Sig: TAKE 1 TABLET(5 MG) BY MOUTH DAILY    Dispense:  90 tablet    Refill:  3  . amLODipine (NORVASC) 5 MG tablet    Sig: TAKE 1 TABLET(5 MG) BY MOUTH DAILY    Dispense:  90 tablet    Refill:  3  . pravastatin (PRAVACHOL) 40 MG tablet    Sig: TAKE 1 TABLET(40 MG) BY MOUTH DAILY    Dispense:  90 tablet    Refill:  3   Patient Instructions    Thank you for coming in today.  No change in medications at this time.  Please schedule wellness exam/physical in the next 6 months.  We will refer you to gastroenterology for colonoscopy.  Pneumonia and flu vaccines were given today.   If you have lab work done today you will be contacted with your lab results within the next 2 weeks.  If you have not heard from [place] then please contact us. The fastest way to get your results is to register for My Chart.   IF you received an x-ray today, you will receive an invoice from [facility]. Please contact [facility] at [phone] with questions or concerns regarding your invoice.   IF you received labwork today, you will receive an invoice from [place]. Please contact [facility] at [phone] with questions or concerns regarding your invoice.   Our billing staff will not be able to assist you with questions regarding bills from these companies.  You will be contacted with the lab results as soon as they are available. The fastest way to get your results is to activate your My Chart account. Instructions are located on the last page of this paperwork. If you have not heard from [place] regarding the results in 2 weeks, please contact this office.       Signed,   [name], MD [facility].  08/29/18 9:34 PM

## 2018-08-28 LAB — COMPREHENSIVE METABOLIC PANEL
ALBUMIN: 4.7 g/dL (ref 3.8–4.8)
ALT: 18 IU/L (ref 0–44)
AST: 18 IU/L (ref 0–40)
Albumin/Globulin Ratio: 1.6 (ref 1.2–2.2)
Alkaline Phosphatase: 65 IU/L (ref 39–117)
BUN / CREAT RATIO: 11 (ref 10–24)
BUN: 12 mg/dL (ref 8–27)
Bilirubin Total: 0.5 mg/dL (ref 0.0–1.2)
CO2: 23 mmol/L (ref 20–29)
Calcium: 9.4 mg/dL (ref 8.6–10.2)
Chloride: 102 mmol/L (ref 96–106)
Creatinine, Ser: 1.06 mg/dL (ref 0.76–1.27)
GFR calc Af Amer: 83 mL/min/{1.73_m2} (ref >59)
GFR calc non Af Amer: 72 mL/min/{1.73_m2} (ref >59)
Globulin, Total: 3 g/dL (ref 1.5–4.5)
Glucose: 88 mg/dL (ref 65–99)
Potassium: 4 mmol/L (ref 3.5–5.2)
SODIUM: 140 mmol/L (ref 134–144)
Total Protein: 7.7 g/dL (ref 6.0–8.5)

## 2018-08-28 LAB — LIPID PANEL
Chol/HDL Ratio: 4.1 ratio (ref 0.0–5.0)
Cholesterol, Total: 172 mg/dL (ref 100–199)
HDL: 42 mg/dL (ref >39)
LDL Calculated: 92 mg/dL (ref 0–99)
Triglycerides: 188 mg/dL — ABNORMAL HIGH (ref 0–149)
VLDL Cholesterol Cal: 38 mg/dL (ref 5–40)

## 2018-09-03 ENCOUNTER — Encounter: Payer: Self-pay | Admitting: Gastroenterology

## 2018-09-06 ENCOUNTER — Encounter: Payer: Self-pay | Admitting: Radiology

## 2018-09-20 ENCOUNTER — Telehealth: Payer: Self-pay | Admitting: *Deleted

## 2018-09-20 NOTE — Telephone Encounter (Signed)
Pt preferred to cancel everything and will cb in May to r/s.

## 2018-09-20 NOTE — Telephone Encounter (Signed)
LMOM- attempted to reach pt to move his colonoscopy until after 10-25-18, let him know we are wanting to do his PV over the phone tomorrow and to verify insurance card.

## 2018-10-08 ENCOUNTER — Encounter: Payer: BLUE CROSS/BLUE SHIELD | Admitting: Gastroenterology

## 2018-12-15 ENCOUNTER — Other Ambulatory Visit: Payer: Self-pay

## 2018-12-15 ENCOUNTER — Ambulatory Visit (AMBULATORY_SURGERY_CENTER): Payer: Self-pay | Admitting: *Deleted

## 2018-12-15 VITALS — Ht 64.0 in | Wt 138.0 lb

## 2018-12-15 DIAGNOSIS — Z1211 Encounter for screening for malignant neoplasm of colon: Secondary | ICD-10-CM

## 2018-12-15 MED ORDER — SUPREP BOWEL PREP KIT 17.5-3.13-1.6 GM/177ML PO SOLN: 1.0000 | Freq: Once | ORAL | 0 refills | Status: AC

## 2018-12-15 NOTE — Progress Notes (Signed)
Previsit in person, scheduled as telephone visit Patient verbalizing understanding after repeated instruction and reading the prep instructions No egg or soy allergy known to patient  No issues with past sedation with any surgeries  or procedures, no intubation problems  No diet pills per patient No home 02 use per patient  No blood thinners per patient  Pt denies issues with constipation  No A fib or A flutter  EMMI  Information given

## 2018-12-17 ENCOUNTER — Encounter: Payer: Self-pay | Admitting: Gastroenterology

## 2018-12-28 ENCOUNTER — Telehealth: Payer: Self-pay | Admitting: Gastroenterology

## 2018-12-28 NOTE — Telephone Encounter (Signed)

## 2018-12-29 ENCOUNTER — Other Ambulatory Visit: Payer: Self-pay

## 2018-12-29 ENCOUNTER — Ambulatory Visit (AMBULATORY_SURGERY_CENTER): Payer: Medicare HMO | Admitting: Gastroenterology

## 2018-12-29 ENCOUNTER — Encounter: Payer: Self-pay | Admitting: Gastroenterology

## 2018-12-29 VITALS — BP 108/66 | HR 56 | Temp 98.2°F | Resp 16 | Ht 64.0 in | Wt 138.0 lb

## 2018-12-29 DIAGNOSIS — D124 Benign neoplasm of descending colon: Secondary | ICD-10-CM | POA: Diagnosis not present

## 2018-12-29 DIAGNOSIS — Z1211 Encounter for screening for malignant neoplasm of colon: Secondary | ICD-10-CM

## 2018-12-29 DIAGNOSIS — D122 Benign neoplasm of ascending colon: Secondary | ICD-10-CM

## 2018-12-29 MED ORDER — SODIUM CHLORIDE 0.9 % IV SOLN: 500.0000 mL | Freq: Once | INTRAVENOUS | Status: DC

## 2018-12-29 NOTE — Progress Notes (Signed)
Pt's states no medical or surgical changes since previsit or office visit. 

## 2018-12-29 NOTE — Op Note (Signed)
[place] Patient Name: Patrick Abbott Procedure Date: 12/29/2018 2:50 PM MRN: [id number] Endoscopist: Mauri Pole , MD Age: 68 Referring MD:  Date of Birth: May 03, 1951 Gender: Male Account #: [id number] Procedure:                Colonoscopy Indications:              Screening for colorectal malignant neoplasm Medicines:                Monitored Anesthesia Care Procedure:                Pre-Anesthesia Assessment:                           - Prior to the procedure, a History and Physical                            was performed, and patient medications and                            allergies were reviewed. The patient's tolerance of                            previous anesthesia was also reviewed. The risks                            and benefits of the procedure and the sedation                            options and risks were discussed with the patient.                            All questions were answered, and informed consent                            was obtained. Prior Anticoagulants: The patient has                            taken no previous anticoagulant or antiplatelet                            agents. ASA Grade Assessment: II - A patient with                            mild systemic disease. After reviewing the risks                            and benefits, the patient was deemed in                            satisfactory condition to undergo the procedure.                           After obtaining informed consent, the colonoscope  was passed under direct vision. Throughout the                            procedure, the patient's blood pressure, pulse, and                            oxygen saturations were monitored continuously. The                            Colonoscope was introduced through the anus and                            advanced to the the cecum, identified by                            appendiceal orifice and  ileocecal valve. The                            colonoscopy was performed without difficulty. The                            patient tolerated the procedure well. The quality                            of the bowel preparation was excellent. The                            ileocecal valve, appendiceal orifice, and rectum                            were photographed. Scope In: 3:05:26 PM Scope Out: 3:23:33 PM Scope Withdrawal Time: 0 hours 10 minutes 22 seconds  Total Procedure Duration: 0 hours 18 minutes 7 seconds  Findings:                 The perianal and digital rectal examinations were                            normal.                           A 15 mm polyp was found in the ascending colon. The                            polyp was sessile. The polyp was removed with a hot                            snare. Resection and retrieval were complete.                           Scattered small and large-mouthed diverticula were                            found in the sigmoid colon, descending colon,  ascending colon and cecum.                           A 2 mm polyp was found in the descending colon. The                            polyp was sessile. The polyp was removed with a                            cold biopsy forceps. Resection and retrieval were                            complete.                           Non-bleeding internal hemorrhoids were found during                            retroflexion. The hemorrhoids were small. Complications:            No immediate complications. Estimated Blood Loss:     Estimated blood loss was minimal. Impression:               - One 15 mm polyp in the ascending colon, removed                            with a hot snare. Resected and retrieved.                           - Diverticulosis in the sigmoid colon, in the                            descending colon, in the ascending colon and in the                             cecum.                           - One 2 mm polyp in the descending colon, removed                            with a cold biopsy forceps. Resected and retrieved.                           - Non-bleeding internal hemorrhoids. Recommendation:           - Patient has a contact number available for                            emergencies. The signs and symptoms of potential                            delayed complications were discussed with the  patient. Return to normal activities tomorrow.                            Written discharge instructions were provided to the                            patient.                           - Resume previous diet.                           - Continue present medications.                           - Await pathology results.                           - Repeat colonoscopy in 3 years for surveillance                            based on pathology results. Mauri Pole, MD 12/29/2018 3:28:41 PM This report has been signed electronically.

## 2018-12-29 NOTE — Progress Notes (Signed)
Called to room to assist during endoscopic procedure.  Patient ID and intended procedure confirmed with present staff. Received instructions for my participation in the procedure from the performing physician.  

## 2018-12-29 NOTE — Progress Notes (Signed)
[place]

## 2018-12-29 NOTE — Progress Notes (Signed)
A/ox3, pleased with MAC, report to RN 

## 2018-12-29 NOTE — Patient Instructions (Signed)
YOU HAD AN ENDOSCOPIC PROCEDURE TODAY AT [facility]:   Refer to the procedure report that was given to you for any specific questions about what was found during the examination.  If the procedure report does not answer your questions, please call your gastroenterologist to clarify.  If you requested that your care partner not be given the details of your procedure findings, then the procedure report has been included in a sealed envelope for you to review at your convenience later.  YOU SHOULD EXPECT: Some feelings of bloating in the abdomen. Passage of more gas than usual.  Walking can help get rid of the air that was put into your GI tract during the procedure and reduce the bloating. If you had a lower endoscopy (such as a colonoscopy or flexible sigmoidoscopy) you may notice spotting of blood in your stool or on the toilet paper. If you underwent a bowel prep for your procedure, you may not have a normal bowel movement for a few days.  Please Note:  You might notice some irritation and congestion in your nose or some drainage.  This is from the oxygen used during your procedure.  There is no need for concern and it should clear up in a day or so.  SYMPTOMS TO REPORT IMMEDIATELY:   Following lower endoscopy (colonoscopy or flexible sigmoidoscopy):  Excessive amounts of blood in the stool  Significant tenderness or worsening of abdominal pains  Swelling of the abdomen that is new, acute  Fever of 100F or higher  For urgent or emergent issues, a gastroenterologist can be reached at any hour by calling [phone].  DIET:  We do recommend a small meal at first, but then you may proceed to your regular diet.  Drink plenty of fluids but you should avoid alcoholic beverages for 24 hours.  ACTIVITY:  You should plan to take it easy for the rest of today and you should NOT DRIVE or use heavy machinery until tomorrow (because of the sedation medicines used during the test).     FOLLOW UP: Our staff will call the number listed on your records 48-72 hours following your procedure to check on you and address any questions or concerns that you may have regarding the information given to you following your procedure. If we do not reach you, we will leave a message.  We will attempt to reach you two times.  During this call, we will ask if you have developed any symptoms of COVID 19. If you develop any symptoms (ie: fever, flu-like symptoms, shortness of breath, cough etc.) before then, please call [phone].  If you test positive for Covid 19 in the 2 weeks post procedure, please call and report this information to [place].    If any biopsies were taken you will be contacted by phone or by letter within the next 1-3 weeks.  Please call us at [phone] if you have not heard about the biopsies in 3 weeks.    SIGNATURES/CONFIDENTIALITY: You and/or your care partner have signed paperwork which will be entered into your electronic medical record.  These signatures attest to the fact that that the information above on your After Visit Summary has been reviewed and is understood.  Full responsibility of the confidentiality of this discharge information lies with you and/or your care-partner.  Please read over handouts about polyps, diverticulosis and hemorrhoids  Please continue your normal medications  Next colonoscopy- 3 year (await pathology)

## 2018-12-31 ENCOUNTER — Telehealth: Payer: Self-pay | Admitting: *Deleted

## 2018-12-31 NOTE — Telephone Encounter (Signed)
  Follow up Call-  Call back number 12/29/2018  Post procedure Call Back phone  # [phone]  Permission to leave phone message Yes  Some recent data might be hidden     Patient questions:  Do you have a fever, pain , or abdominal swelling? No. Pain Score  0 *  Have you tolerated food without any problems? Yes.    Have you been able to return to your normal activities? Yes.    Do you have any questions about your discharge instructions: Diet   No. Medications  No. Follow up visit  No.  Do you have questions or concerns about your Care? Yes.  Pt stated he had some blood in his stool yesterday but no clots and it wasn't heavy bleeding.  I explained the bleeding should clear up that with polyps removed during his procedure a small amount of blood is to be expected.  I advised him to call back if bleeding increases or he starts having clots. No further questions.   Actions: * If pain score is 4 or above: No action needed, pain <4.  1. Have you developed a fever since your procedure? NO  2.   Have you had an respiratory symptoms (SOB or cough) since your procedure? NO  3.   Have you tested positive for COVID 19 since your procedure NO  4.   Have you had any family members/close contacts diagnosed with the COVID 19 since your procedure?  NO   If yes to any of these questions please route to Joylene John, RN and Alphonsa Gin, RN.

## 2019-01-11 ENCOUNTER — Encounter: Payer: Self-pay | Admitting: Gastroenterology

## 2019-02-24 ENCOUNTER — Encounter: Payer: Self-pay | Admitting: Family Medicine

## 2019-02-24 ENCOUNTER — Ambulatory Visit (INDEPENDENT_AMBULATORY_CARE_PROVIDER_SITE_OTHER): Payer: Medicare HMO | Admitting: Family Medicine

## 2019-02-24 ENCOUNTER — Other Ambulatory Visit: Payer: Self-pay

## 2019-02-24 VITALS — BP 116/62 | HR 81 | Temp 99.0°F | Resp 12 | Wt 134.4 lb

## 2019-02-24 DIAGNOSIS — E7800 Pure hypercholesterolemia, unspecified: Secondary | ICD-10-CM | POA: Diagnosis not present

## 2019-02-24 DIAGNOSIS — Z0001 Encounter for general adult medical examination with abnormal findings: Secondary | ICD-10-CM | POA: Diagnosis not present

## 2019-02-24 DIAGNOSIS — I10 Essential (primary) hypertension: Secondary | ICD-10-CM | POA: Diagnosis not present

## 2019-02-24 DIAGNOSIS — Z23 Encounter for immunization: Secondary | ICD-10-CM

## 2019-02-24 DIAGNOSIS — N401 Enlarged prostate with lower urinary tract symptoms: Secondary | ICD-10-CM | POA: Diagnosis not present

## 2019-02-24 DIAGNOSIS — Z125 Encounter for screening for malignant neoplasm of prostate: Secondary | ICD-10-CM | POA: Diagnosis not present

## 2019-02-24 DIAGNOSIS — E785 Hyperlipidemia, unspecified: Secondary | ICD-10-CM | POA: Diagnosis not present

## 2019-02-24 DIAGNOSIS — K4090 Unilateral inguinal hernia, without obstruction or gangrene, not specified as recurrent: Secondary | ICD-10-CM

## 2019-02-24 DIAGNOSIS — Z0000 Encounter for general adult medical examination without abnormal findings: Secondary | ICD-10-CM

## 2019-02-24 DIAGNOSIS — R351 Nocturia: Secondary | ICD-10-CM

## 2019-02-24 MED ORDER — SHINGRIX 50 MCG/0.5ML IM SUSR: 0.5000 mL | Freq: Once | INTRAMUSCULAR | 1 refills | Status: AC

## 2019-02-24 MED ORDER — AMLODIPINE BESYLATE 5 MG PO TABS: ORAL_TABLET | ORAL | 3 refills | Status: DC

## 2019-02-24 MED ORDER — PRAVASTATIN SODIUM 40 MG PO TABS: ORAL_TABLET | ORAL | 3 refills | Status: DC

## 2019-02-24 MED ORDER — TAMSULOSIN HCL 0.4 MG PO CAPS: 0.4000 mg | ORAL_CAPSULE | Freq: Every day | ORAL | 1 refills | Status: DC

## 2019-02-24 NOTE — Progress Notes (Signed)
Subjective:    Patient ID: Patrick Abbott, male    DOB: July 21, 1950, 68 y.o.   MRN: [id number]  HPI Patrick Abbott is a 68 y.o. male Presents today for: Chief Complaint  Patient presents with  . Annual Exam    Patient also would like to let you know that he is still having issues with his left hernia every now and then. Had surgery on that area back in 1992 or 93   Here for wellness exam and med review.  Care team: Gastroenterology, Dr. Silverio Decamp   Hypertension: BP Readings from Last 3 Encounters:  02/24/19 116/62  12/29/18 108/66  08/27/18 130/81   Lab Results  Component Value Date   CREATININE 1.06 08/27/2018  Amlodipine 5 mg daily.  Aspirin once per week.  BPH with nocturia: Takes Flomax 0.4 mg daily. Taking daily.  Urinating ok.   Hyperlipidemia:  Lab Results  Component Value Date   CHOL 172 08/27/2018   HDL 42 08/27/2018   LDLCALC 92 08/27/2018   TRIG 188 (H) 08/27/2018   CHOLHDL 4.1 08/27/2018   Lab Results  Component Value Date   ALT 18 08/27/2018   AST 18 08/27/2018   ALKPHOS 65 08/27/2018   BILITOT 0.5 08/27/2018  Pravastatin 40 mg daily, no new side effects.   Groin pain: History of L inguinal hernia repair in 1992-1993.  Off and on pain in area past few weeks. Feels ok now.  Noticed some swelling/bulge. Able to push back in - wears hernia belt.  Discussed in March - planned on continuing hernia belt.  No n/v/d, no stool changes, no other abd pain.  Cancer screening Colonoscopy 12/29/2018 Prostate PSA 1.2 in June 2018 - r/b of testing discussed, but agreed to testing as on treatment for BPH.   Immunization History  Administered Date(s) Administered  . Fluad Quad(high Dose 65+) 02/24/2019  . Influenza, High Dose Seasonal PF 08/27/2018  . Influenza,inj,Quad PF,6+ Mos 04/10/2015, 06/02/2016, 06/12/2017  . Pneumococcal Conjugate-13 10/04/2015  . Pneumococcal Polysaccharide-23 08/27/2018  . Tdap 12/23/2012  . Zoster 09/16/2014  Shingrix: agrees to Rx.    Fall screening, no falls in the past year  Depression screen [facility] 2/9 02/24/2019 08/27/2018 06/12/2017 11/29/2016 06/02/2016  Decreased Interest 0 0 0 0 0  Down, Depressed, Hopeless 0 0 0 0 0  PHQ - 2 Score 0 0 0 0 0  Altered sleeping - - - - -  Tired, decreased energy - - - - -  Change in appetite - - - - -  Feeling bad or failure about yourself  - - - - -  Trouble concentrating - - - - -  Moving slowly or fidgety/restless - - - - -  Suicidal thoughts - - - - -  PHQ-9 Score - - - - -   Functional Status Survey: Is the patient deaf or have difficulty hearing?: No Does the patient have difficulty seeing, even when wearing glasses/contacts?: No Does the patient have difficulty concentrating, remembering, or making decisions?: No Does the patient have difficulty walking or climbing stairs?: No Does the patient have difficulty dressing or bathing?: No Does the patient have difficulty doing errands alone such as visiting a doctor's office or shopping?: No   Memory screening 6CIT Screen 02/24/2019  What Year? 0 points  What month? 0 points  What time? 0 points  Count back from 20 0 points  Months in reverse 0 points  Repeat phrase 0 points  Total Score 0    Hearing Screening  125Hz  250Hz  500Hz  1000Hz  2000Hz  3000Hz  4000Hz  6000Hz  8000Hz   Right ear:           Left ear:             Visual Acuity Screening   Right eye Left eye Both eyes  Without correction:     With correction: 20/30 20/30 20/25   last optho visit last year or so.   Alcohol/tobacco:none  Dental: every 6 months.   Exercise:  Walking, busy with activity around the house.   Advanced directives: Information provided.     Patient Active Problem List   Diagnosis Date Noted  . Benign prostatic hyperplasia with nocturia 10/04/2015  . Essential hypertension 09/16/2014  . Pure hypercholesterolemia 09/16/2014  . CTS (carpal tunnel syndrome) 02/18/2014   Past Medical History:  Diagnosis Date  . Depression   .  Hyperlipidemia   . Hypertension    Past Surgical History:  Procedure Laterality Date  . HERNIA REPAIR     Left   No Known Allergies Prior to Admission medications   Medication Sig Start Date End Date Taking? Authorizing Provider  amLODipine (NORVASC) 5 MG tablet TAKE 1 TABLET(5 MG) BY MOUTH DAILY 08/27/18  Yes Wendie Agreste, MD  aspirin 325 MG tablet Take 325 mg by mouth daily. Reported on 10/04/2015   Yes [provider]  Calcium Carb-Cholecalciferol (CALCIUM 1000 + D PO) Take by mouth.   Yes [provider]  pravastatin (PRAVACHOL) 40 MG tablet TAKE 1 TABLET(40 MG) BY MOUTH DAILY 08/27/18  Yes Wendie Agreste, MD  tamsulosin (FLOMAX) 0.4 MG CAPS capsule Take 1 capsule (0.4 mg total) by mouth daily. 05/07/18  Yes Sagardia, Ines Bloomer, MD  VITAMIN D, CHOLECALCIFEROL, PO Take 1 tablet by mouth daily.   Yes [provider]   Social History   Socioeconomic History  . Marital status: Married    Spouse name: Not on file  . Number of children: Not on file  . Years of education: Not on file  . Highest education level: Not on file  Occupational History  . Not on file  Social Needs  . Financial resource strain: Not on file  . Food insecurity    Worry: Not on file    Inability: Not on file  . Transportation needs    Medical: Not on file    Non-medical: Not on file  Tobacco Use  . Smoking status: Never Smoker  . Smokeless tobacco: Never Used  Substance and Sexual Activity  . Alcohol use: No  . Drug use: No  . Sexual activity: Yes  Lifestyle  . Physical activity    Days per week: Not on file    Minutes per session: Not on file  . Stress: Not on file  Relationships  . Social Herbalist on phone: Not on file    Gets together: Not on file    Attends religious service: Not on file    Active member of club or organization: Not on file    Attends meetings of clubs or organizations: Not on file    Relationship status: Not on file  . Intimate  partner violence    Fear of current or ex partner: Not on file    Emotionally abused: Not on file    Physically abused: Not on file    Forced sexual activity: Not on file  Other Topics Concern  . Not on file  Social History Narrative   Marital status: married x 32 years; moved  from [place] in 1982.      Children: 3 children; 1 grandchild.      Employment:  Water engineer.      Tobacco: none      Alcohol:  Once per month      Exercise:  Weights, walking.     Review of Systems 13 point review of systems per patient health survey noted.  Negative other than as indicated above or in HPI.      Objective:   Physical Exam Vitals signs reviewed.  Constitutional:      Appearance: He is well-developed.  HENT:     Head: Normocephalic and atraumatic.     Right Ear: External ear normal.     Left Ear: External ear normal.  Eyes:     Conjunctiva/sclera: Conjunctivae normal.     Pupils: Pupils are equal, round, and reactive to light.  Neck:     Musculoskeletal: Normal range of motion and neck supple.     Thyroid: No thyromegaly.  Cardiovascular:     Rate and Rhythm: Normal rate and regular rhythm.     Heart sounds: Normal heart sounds.  Pulmonary:     Effort: Pulmonary effort is normal. No respiratory distress.     Breath sounds: Normal breath sounds. No wheezing.  Abdominal:     General: There is no distension.     Palpations: Abdomen is soft.     Tenderness: There is no abdominal tenderness.     Hernia: A hernia is present. Hernia is present in the left inguinal area. There is no hernia in the right inguinal area.  Genitourinary:    Prostate: Enlarged. Not tender and no nodules present.    Musculoskeletal: Normal range of motion.        General: No tenderness.  Lymphadenopathy:     Cervical: No cervical adenopathy.  Skin:    General: Skin is warm and dry.  Neurological:     Mental Status: He is alert and oriented to person, place, and time.     Deep Tendon Reflexes:  Reflexes are normal and symmetric.  Psychiatric:        Behavior: Behavior normal.    Vitals:   02/24/19 1530  BP: 116/62  Pulse: 81  Resp: 12  Temp: 99 F (37.2 C)  TempSrc: Oral  SpO2: 98%  Weight: 134 lb 6.4 oz (61 kg)       Assessment & Plan:    Cheo Selvey is a 68 y.o. male Medicare annual wellness visit, subsequent  - - anticipatory guidance as below in AVS, screening labs if needed. Health maintenance items as above in HPI discussed/recommended as applicable.  - no concerning responses on depression, fall, or functional status screening. Any positive responses noted as above. Advanced directives discussed as in CHL.   Need for prophylactic vaccination and inoculation against influenza - Plan: Flu Vaccine QUAD High Dose(Fluad)  Need for shingles vaccine - Plan: Zoster Vaccine Adjuvanted [facility]) injection  -Sent to pharmacy  Left inguinal hernia - Plan: Ambulatory referral to General Surgery  -Longstanding symptoms treated with hernia belt.  Intermittent discomfort.  Recommended discussion of treatment options with general surgery, referral placed.  Handout given on hernia with ER precautions  Hyperlipidemia, unspecified hyperlipidemia type Pure hypercholesterolemia - Plan: Comprehensive metabolic panel, Lipid panel, pravastatin (PRAVACHOL) 40 MG tablet  -Tolerating pravastatin, continue same.  Essential hypertension - Plan: amLODipine (NORVASC) 5 MG tablet  -  Stable, tolerating current regimen. Medications refilled. Labs pending as above.   Benign  prostatic hyperplasia with nocturia - Plan: PSA, tamsulosin (FLOMAX) 0.4 MG CAPS capsule Screening for malignant neoplasm of prostate - Plan: PSA  -Stable control symptoms on tamsulosin, check PSA, no acute findings noted on exam.  Meds ordered this encounter  Medications  . Zoster Vaccine Adjuvanted [facility]) injection    Sig: Inject 0.5 mLs into the muscle once for 1 dose. Repeat in 2-6 months.    Dispense:  0.5  mL    Refill:  1  . amLODipine (NORVASC) 5 MG tablet    Sig: TAKE 1 TABLET(5 MG) BY MOUTH DAILY    Dispense:  90 tablet    Refill:  3  . pravastatin (PRAVACHOL) 40 MG tablet    Sig: TAKE 1 TABLET(40 MG) BY MOUTH DAILY    Dispense:  90 tablet    Refill:  3  . tamsulosin (FLOMAX) 0.4 MG CAPS capsule    Sig: Take 1 capsule (0.4 mg total) by mouth daily.    Dispense:  90 capsule    Refill:  1   Patient Instructions   I still recommend meeting with general surgeon and hernia options. I will refer you to general surgeon to discuss options.   Shingles vaccine sent to your pharmacy.   No med changes for now.   Return to the clinic or go to the nearest emergency room if any of your symptoms worsen or new symptoms occur.   Preventive Care 26 Years and Older, Male Preventive care refers to lifestyle choices and visits with your health care provider that can promote health and wellness. This includes:  A yearly physical exam. This is also called an annual well check.  Regular dental and eye exams.  Immunizations.  Screening for certain conditions.  Healthy lifestyle choices, such as diet and exercise. What can I expect for my preventive care visit? Physical exam Your health care provider will check:  Height and weight. These may be used to calculate body mass index (BMI), which is a measurement that tells if you are at a healthy weight.  Heart rate and blood pressure.  Your skin for abnormal spots. Counseling Your health care provider may ask you questions about:  Alcohol, tobacco, and drug use.  Emotional well-being.  Home and relationship well-being.  Sexual activity.  Eating habits.  History of falls.  Memory and ability to understand (cognition).  Work and work Statistician. What immunizations do I need?  Influenza (flu) vaccine  This is recommended every year. Tetanus, diphtheria, and pertussis (Tdap) vaccine  You may need a Td booster every 10 years.  Varicella (chickenpox) vaccine  You may need this vaccine if you have not already been vaccinated. Zoster (shingles) vaccine  You may need this after age 47. Pneumococcal conjugate (PCV13) vaccine  One dose is recommended after age 70. Pneumococcal polysaccharide (PPSV23) vaccine  One dose is recommended after age 30. Measles, mumps, and rubella (MMR) vaccine  You may need at least one dose of MMR if you were born in 1957 or later. You may also need a second dose. Meningococcal conjugate (MenACWY) vaccine  You may need this if you have certain conditions. Hepatitis A vaccine  You may need this if you have certain conditions or if you travel or work in places where you may be exposed to hepatitis A. Hepatitis B vaccine  You may need this if you have certain conditions or if you travel or work in places where you may be exposed to hepatitis B. Haemophilus influenzae type b (Hib)  vaccine  You may need this if you have certain conditions. You may receive vaccines as individual doses or as more than one vaccine together in one shot (combination vaccines). Talk with your health care provider about the risks and benefits of combination vaccines. What tests do I need? Blood tests  Lipid and cholesterol levels. These may be checked every 5 years, or more frequently depending on your overall health.  Hepatitis C test.  Hepatitis B test. Screening  Lung cancer screening. You may have this screening every year starting at age 60 if you have a 30-pack-year history of smoking and currently smoke or have quit within the past 15 years.  Colorectal cancer screening. All adults should have this screening starting at age 68 and continuing until age 107. Your health care provider may recommend screening at age 18 if you are at increased risk. You will have tests every 1-10 years, depending on your results and the type of screening test.  Prostate cancer screening. Recommendations will vary  depending on your family history and other risks.  Diabetes screening. This is done by checking your blood sugar (glucose) after you have not eaten for a while (fasting). You may have this done every 1-3 years.  Abdominal aortic aneurysm (AAA) screening. You may need this if you are a current or former smoker.  Sexually transmitted disease (STD) testing. Follow these instructions at home: Eating and drinking  Eat a diet that includes fresh fruits and vegetables, whole grains, lean protein, and low-fat dairy products. Limit your intake of foods with high amounts of sugar, saturated fats, and salt.  Take vitamin and mineral supplements as recommended by your health care provider.  Do not drink alcohol if your health care provider tells you not to drink.  If you drink alcohol: ? Limit how much you have to 0-2 drinks a day. ? Be aware of how much alcohol is in your drink. In the [place] one drink equals one 12 oz bottle of beer (355 mL), one 5 oz glass of wine (148 mL), or one 1 oz glass of hard liquor (44 mL). Lifestyle  Take daily care of your teeth and gums.  Stay active. Exercise for at least 30 minutes on 5 or more days each week.  Do not use any products that contain nicotine or tobacco, such as cigarettes, e-cigarettes, and chewing tobacco. If you need help quitting, ask your health care provider.  If you are sexually active, practice safe sex. Use a condom or other form of protection to prevent STIs (sexually transmitted infections).  Talk with your health care provider about taking a low-dose aspirin or statin. What's next?  Visit your health care provider once a year for a well check visit.  Ask your health care provider how often you should have your eyes and teeth checked.  Stay up to date on all vaccines. This information is not intended to replace advice given to you by your health care provider. Make sure you discuss any questions you have with your health care  provider. Document Released: 07/06/2015 Document Revised: 06/03/2018 Document Reviewed: 06/03/2018 Elsevier Patient Education  2020 [place], Adult     A hernia is the bulging of an organ or tissue through a weak spot in the muscles of the abdomen (abdominal wall). Hernias develop most often near the belly button (navel) or the area where the leg meets the lower abdomen (groin). Common types of hernias include:  Incisional hernia. This type  bulges through a scar from an abdominal surgery.  Umbilical hernia. This type develops near the navel.  Inguinal hernia. This type develops in the groin or scrotum.  Femoral hernia. This type develops under the groin, in the upper thigh area.  Hiatal hernia. This type occurs when part of the stomach slides above the muscle that separates the abdomen from the chest (diaphragm). What are the causes? This condition may be caused by:  Heavy lifting.  Coughing over a long period of time.  Straining to have a bowel movement. Constipation can lead to straining.  An incision made during an abdominal surgery.  A physical problem that is present at birth (congenital defect).  Being overweight or obese.  Smoking.  Excess fluid in the abdomen.  Undescended testicles in males. What are the signs or symptoms? The main symptom is a skin-colored, rounded bulge in the area of the hernia. However, a bulge may not always be present. It may grow bigger or be more visible when you cough or strain (such as when lifting something heavy). A hernia that can be pushed back into the area (is reducible) rarely causes pain. A hernia that cannot be pushed back into the area (is incarcerated) may lose its blood supply (become strangulated). A hernia that is incarcerated may cause:  Pain.  Fever.  Nausea and vomiting.  Swelling.  Constipation. How is this diagnosed? A hernia may be diagnosed based on:  Your symptoms and medical history.   A physical exam. Your health care provider may ask you to cough or move in certain ways to see if the hernia becomes visible.  Imaging tests, such as: ? X-rays. ? Ultrasound. ? CT scan. How is this treated? A hernia that is small and painless may not need to be treated. A hernia that is large or painful may be treated with surgery. Inguinal hernias may be treated with surgery to prevent incarceration or strangulation. Strangulated hernias are always treated with surgery because a lack of blood supply to the trapped organ or tissue can cause it to die. Surgery to treat a hernia involves pushing the bulge back into place and repairing the weak area of the muscle or abdominal wall. Follow these instructions at home: Activity  Avoid straining.  Do not lift anything that is heavier than 10 lb (4.5 kg), or the limit that you are told, until your health care provider says that it is safe.  When lifting heavy objects, lift with your leg muscles, not your back muscles. Preventing constipation  Take actions to prevent constipation. Constipation leads to straining with bowel movements, which can make a hernia worse or cause a hernia repair to break down. Your health care provider may recommend that you: ? Drink enough fluid to keep your urine pale yellow. ? Eat foods that are high in fiber, such as fresh fruits and vegetables, whole grains, and beans. ? Limit foods that are high in fat and processed sugars, such as fried or sweet foods. ? Take an over-the-counter or prescription medicine for constipation. General instructions  When coughing, try to cough gently.  You may try to push the hernia back in place by very gently pressing on it while lying down. Do not try to force the bulge back in if it will not push in easily.  If you are overweight, work with your health care provider to lose weight safely.  Do not use any products that contain nicotine or tobacco, such as cigarettes and  e-cigarettes. If  you need help quitting, ask your health care provider.  If you are scheduled for hernia repair, watch your hernia for any changes in shape, size, or color. Tell your health care provider about any changes or new symptoms.  Take over-the-counter and prescription medicines only as told by your health care provider.  Keep all follow-up visits as told by your health care provider. This is important. Contact a health care provider if:  You develop new pain, swelling, or redness around your hernia.  You have signs of constipation, such as: ? Fewer bowel movements in a week than normal. ? Difficulty having a bowel movement. ? Stools that are dry, hard, or larger than normal. Get help right away if:  You have a fever.  You have abdomen pain that gets worse.  You feel nauseous or you vomit.  You cannot push the hernia back in place by very gently pressing on it while lying down. Do not try to force the bulge back in if it will not push in easily.  The hernia: ? Changes in shape, size, or color. ? Feels hard or tender. These symptoms may represent a serious problem that is an emergency. Do not wait to see if the symptoms will go away. Get medical help right away. Call your local emergency services (911 in the [place] Summary  A hernia is the bulging of an organ or tissue through a weak spot in the muscles of the abdomen (abdominal wall).  The main symptom is a skin-colored, rounded lump (bulge) in the hernia area. However, a bulge may not always be present. It may grow bigger or more visible when you cough or strain (such as when having a bowel movement).  A hernia that is small and painless may not need to be treated. A hernia that is large or painful may be treated with surgery.  Surgery to treat a hernia involves pushing the bulge back into place and repairing the weak part of the abdomen. This information is not intended to replace advice given to you by your health  care provider. Make sure you discuss any questions you have with your health care provider. Document Released: 06/09/2005 Document Revised: 09/30/2018 Document Reviewed: 03/11/2017 Elsevier Patient Education  [employer].   If you have lab work done today you will be contacted with your lab results within the next 2 weeks.  If you have not heard from [place] then please contact us. The fastest way to get your results is to register for My Chart.   IF you received an x-ray today, you will receive an invoice from [facility]. Please contact [facility] at [phone] with questions or concerns regarding your invoice.   IF you received labwork today, you will receive an invoice from [facility]. Please contact [facility] at [phone] with questions or concerns regarding your invoice.   Our billing staff will not be able to assist you with questions regarding bills from these companies.  You will be contacted with the lab results as soon as they are available. The fastest way to get your results is to activate your My Chart account. Instructions are located on the last page of this paperwork. If you have not heard from [place] regarding the results in 2 weeks, please contact this office.       Signed,   [name], MD [facility].  02/25/19 12:22 PM

## 2019-02-24 NOTE — Patient Instructions (Addendum)
I still recommend meeting with general surgeon and hernia options. I will refer you to general surgeon to discuss options.   Shingles vaccine sent to your pharmacy.   No med changes for now.   Return to the clinic or go to the nearest emergency room if any of your symptoms worsen or new symptoms occur.   Preventive Care 60 Years and Older, Male Preventive care refers to lifestyle choices and visits with your health care provider that can promote health and wellness. This includes:  A yearly physical exam. This is also called an annual well check.  Regular dental and eye exams.  Immunizations.  Screening for certain conditions.  Healthy lifestyle choices, such as diet and exercise. What can I expect for my preventive care visit? Physical exam Your health care provider will check:  Height and weight. These may be used to calculate body mass index (BMI), which is a measurement that tells if you are at a healthy weight.  Heart rate and blood pressure.  Your skin for abnormal spots. Counseling Your health care provider may ask you questions about:  Alcohol, tobacco, and drug use.  Emotional well-being.  Home and relationship well-being.  Sexual activity.  Eating habits.  History of falls.  Memory and ability to understand (cognition).  Work and work Statistician. What immunizations do I need?  Influenza (flu) vaccine  This is recommended every year. Tetanus, diphtheria, and pertussis (Tdap) vaccine  You may need a Td booster every 10 years. Varicella (chickenpox) vaccine  You may need this vaccine if you have not already been vaccinated. Zoster (shingles) vaccine  You may need this after age 34. Pneumococcal conjugate (PCV13) vaccine  One dose is recommended after age 62. Pneumococcal polysaccharide (PPSV23) vaccine  One dose is recommended after age 70. Measles, mumps, and rubella (MMR) vaccine  You may need at least one dose of MMR if you were born in  1957 or later. You may also need a second dose. Meningococcal conjugate (MenACWY) vaccine  You may need this if you have certain conditions. Hepatitis A vaccine  You may need this if you have certain conditions or if you travel or work in places where you may be exposed to hepatitis A. Hepatitis B vaccine  You may need this if you have certain conditions or if you travel or work in places where you may be exposed to hepatitis B. Haemophilus influenzae type b (Hib) vaccine  You may need this if you have certain conditions. You may receive vaccines as individual doses or as more than one vaccine together in one shot (combination vaccines). Talk with your health care provider about the risks and benefits of combination vaccines. What tests do I need? Blood tests  Lipid and cholesterol levels. These may be checked every 5 years, or more frequently depending on your overall health.  Hepatitis C test.  Hepatitis B test. Screening  Lung cancer screening. You may have this screening every year starting at age 43 if you have a 30-pack-year history of smoking and currently smoke or have quit within the past 15 years.  Colorectal cancer screening. All adults should have this screening starting at age 22 and continuing until age 82. Your health care provider may recommend screening at age 52 if you are at increased risk. You will have tests every 1-10 years, depending on your results and the type of screening test.  Prostate cancer screening. Recommendations will vary depending on your family history and other risks.  Diabetes screening.  This is done by checking your blood sugar (glucose) after you have not eaten for a while (fasting). You may have this done every 1-3 years.  Abdominal aortic aneurysm (AAA) screening. You may need this if you are a current or former smoker.  Sexually transmitted disease (STD) testing. Follow these instructions at home: Eating and drinking  Eat a diet that  includes fresh fruits and vegetables, whole grains, lean protein, and low-fat dairy products. Limit your intake of foods with high amounts of sugar, saturated fats, and salt.  Take vitamin and mineral supplements as recommended by your health care provider.  Do not drink alcohol if your health care provider tells you not to drink.  If you drink alcohol: ? Limit how much you have to 0-2 drinks a day. ? Be aware of how much alcohol is in your drink. In the [place] one drink equals one 12 oz bottle of beer (355 mL), one 5 oz glass of wine (148 mL), or one 1 oz glass of hard liquor (44 mL). Lifestyle  Take daily care of your teeth and gums.  Stay active. Exercise for at least 30 minutes on 5 or more days each week.  Do not use any products that contain nicotine or tobacco, such as cigarettes, e-cigarettes, and chewing tobacco. If you need help quitting, ask your health care provider.  If you are sexually active, practice safe sex. Use a condom or other form of protection to prevent STIs (sexually transmitted infections).  Talk with your health care provider about taking a low-dose aspirin or statin. What's next?  Visit your health care provider once a year for a well check visit.  Ask your health care provider how often you should have your eyes and teeth checked.  Stay up to date on all vaccines. This information is not intended to replace advice given to you by your health care provider. Make sure you discuss any questions you have with your health care provider. Document Released: 07/06/2015 Document Revised: 06/03/2018 Document Reviewed: 06/03/2018 Elsevier Patient Education  2020 [place], Adult     A hernia is the bulging of an organ or tissue through a weak spot in the muscles of the abdomen (abdominal wall). Hernias develop most often near the belly button (navel) or the area where the leg meets the lower abdomen (groin). Common types of hernias  include:  Incisional hernia. This type bulges through a scar from an abdominal surgery.  Umbilical hernia. This type develops near the navel.  Inguinal hernia. This type develops in the groin or scrotum.  Femoral hernia. This type develops under the groin, in the upper thigh area.  Hiatal hernia. This type occurs when part of the stomach slides above the muscle that separates the abdomen from the chest (diaphragm). What are the causes? This condition may be caused by:  Heavy lifting.  Coughing over a long period of time.  Straining to have a bowel movement. Constipation can lead to straining.  An incision made during an abdominal surgery.  A physical problem that is present at birth (congenital defect).  Being overweight or obese.  Smoking.  Excess fluid in the abdomen.  Undescended testicles in males. What are the signs or symptoms? The main symptom is a skin-colored, rounded bulge in the area of the hernia. However, a bulge may not always be present. It may grow bigger or be more visible when you cough or strain (such as when lifting something heavy). A hernia  that can be pushed back into the area (is reducible) rarely causes pain. A hernia that cannot be pushed back into the area (is incarcerated) may lose its blood supply (become strangulated). A hernia that is incarcerated may cause:  Pain.  Fever.  Nausea and vomiting.  Swelling.  Constipation. How is this diagnosed? A hernia may be diagnosed based on:  Your symptoms and medical history.  A physical exam. Your health care provider may ask you to cough or move in certain ways to see if the hernia becomes visible.  Imaging tests, such as: ? X-rays. ? Ultrasound. ? CT scan. How is this treated? A hernia that is small and painless may not need to be treated. A hernia that is large or painful may be treated with surgery. Inguinal hernias may be treated with surgery to prevent incarceration or strangulation.  Strangulated hernias are always treated with surgery because a lack of blood supply to the trapped organ or tissue can cause it to die. Surgery to treat a hernia involves pushing the bulge back into place and repairing the weak area of the muscle or abdominal wall. Follow these instructions at home: Activity  Avoid straining.  Do not lift anything that is heavier than 10 lb (4.5 kg), or the limit that you are told, until your health care provider says that it is safe.  When lifting heavy objects, lift with your leg muscles, not your back muscles. Preventing constipation  Take actions to prevent constipation. Constipation leads to straining with bowel movements, which can make a hernia worse or cause a hernia repair to break down. Your health care provider may recommend that you: ? Drink enough fluid to keep your urine pale yellow. ? Eat foods that are high in fiber, such as fresh fruits and vegetables, whole grains, and beans. ? Limit foods that are high in fat and processed sugars, such as fried or sweet foods. ? Take an over-the-counter or prescription medicine for constipation. General instructions  When coughing, try to cough gently.  You may try to push the hernia back in place by very gently pressing on it while lying down. Do not try to force the bulge back in if it will not push in easily.  If you are overweight, work with your health care provider to lose weight safely.  Do not use any products that contain nicotine or tobacco, such as cigarettes and e-cigarettes. If you need help quitting, ask your health care provider.  If you are scheduled for hernia repair, watch your hernia for any changes in shape, size, or color. Tell your health care provider about any changes or new symptoms.  Take over-the-counter and prescription medicines only as told by your health care provider.  Keep all follow-up visits as told by your health care provider. This is important. Contact a health  care provider if:  You develop new pain, swelling, or redness around your hernia.  You have signs of constipation, such as: ? Fewer bowel movements in a week than normal. ? Difficulty having a bowel movement. ? Stools that are dry, hard, or larger than normal. Get help right away if:  You have a fever.  You have abdomen pain that gets worse.  You feel nauseous or you vomit.  You cannot push the hernia back in place by very gently pressing on it while lying down. Do not try to force the bulge back in if it will not push in easily.  The hernia: ? Changes in shape,  size, or color. ? Feels hard or tender. These symptoms may represent a serious problem that is an emergency. Do not wait to see if the symptoms will go away. Get medical help right away. Call your local emergency services (911 in the [place] Summary  A hernia is the bulging of an organ or tissue through a weak spot in the muscles of the abdomen (abdominal wall).  The main symptom is a skin-colored, rounded lump (bulge) in the hernia area. However, a bulge may not always be present. It may grow bigger or more visible when you cough or strain (such as when having a bowel movement).  A hernia that is small and painless may not need to be treated. A hernia that is large or painful may be treated with surgery.  Surgery to treat a hernia involves pushing the bulge back into place and repairing the weak part of the abdomen. This information is not intended to replace advice given to you by your health care provider. Make sure you discuss any questions you have with your health care provider. Document Released: 06/09/2005 Document Revised: 09/30/2018 Document Reviewed: 03/11/2017 Elsevier Patient Education  [employer].   If you have lab work done today you will be contacted with your lab results within the next 2 weeks.  If you have not heard from [place] then please contact us. The fastest way to get your results is to register  for My Chart.   IF you received an x-ray today, you will receive an invoice from [facility]. Please contact [facility] at [phone] with questions or concerns regarding your invoice.   IF you received labwork today, you will receive an invoice from [facility]. Please contact [facility] at [phone] with questions or concerns regarding your invoice.   Our billing staff will not be able to assist you with questions regarding bills from these companies.  You will be contacted with the lab results as soon as they are available. The fastest way to get your results is to activate your My Chart account. Instructions are located on the last page of this paperwork. If you have not heard from [place] regarding the results in 2 weeks, please contact this office.

## 2019-02-25 LAB — LIPID PANEL
Chol/HDL Ratio: 3.6 ratio (ref 0.0–5.0)
Cholesterol, Total: 149 mg/dL (ref 100–199)
HDL: 41 mg/dL (ref >39)
LDL Chol Calc (NIH): 83 mg/dL (ref 0–99)
Triglycerides: 143 mg/dL (ref 0–149)
VLDL Cholesterol Cal: 25 mg/dL (ref 5–40)

## 2019-02-25 LAB — COMPREHENSIVE METABOLIC PANEL
ALT: 15 IU/L (ref 0–44)
AST: 17 IU/L (ref 0–40)
Albumin/Globulin Ratio: 1.4 (ref 1.2–2.2)
Albumin: 4.2 g/dL (ref 3.8–4.8)
Alkaline Phosphatase: 58 IU/L (ref 39–117)
BUN/Creatinine Ratio: 14 (ref 10–24)
BUN: 16 mg/dL (ref 8–27)
Bilirubin Total: 0.4 mg/dL (ref 0.0–1.2)
CO2: 23 mmol/L (ref 20–29)
Calcium: 9.1 mg/dL (ref 8.6–10.2)
Chloride: 105 mmol/L (ref 96–106)
Creatinine, Ser: 1.11 mg/dL (ref 0.76–1.27)
GFR calc Af Amer: 78 mL/min/{1.73_m2} (ref >59)
GFR calc non Af Amer: 68 mL/min/{1.73_m2} (ref >59)
Globulin, Total: 2.9 g/dL (ref 1.5–4.5)
Glucose: 108 mg/dL — ABNORMAL HIGH (ref 65–99)
Potassium: 3.7 mmol/L (ref 3.5–5.2)
Sodium: 140 mmol/L (ref 134–144)
Total Protein: 7.1 g/dL (ref 6.0–8.5)

## 2019-02-25 LAB — PSA: Prostate Specific Ag, Serum: 1 ng/mL (ref 0.0–4.0)

## 2019-03-01 ENCOUNTER — Encounter: Payer: Self-pay | Admitting: Radiology

## 2019-03-28 ENCOUNTER — Other Ambulatory Visit: Payer: Self-pay

## 2019-03-28 ENCOUNTER — Telehealth (INDEPENDENT_AMBULATORY_CARE_PROVIDER_SITE_OTHER): Payer: Medicare HMO | Admitting: Family Medicine

## 2019-03-28 DIAGNOSIS — Z20822 Contact with and (suspected) exposure to covid-19: Secondary | ICD-10-CM

## 2019-03-28 DIAGNOSIS — R05 Cough: Secondary | ICD-10-CM

## 2019-03-28 DIAGNOSIS — J029 Acute pharyngitis, unspecified: Secondary | ICD-10-CM

## 2019-03-28 DIAGNOSIS — Z20828 Contact with and (suspected) exposure to other viral communicable diseases: Secondary | ICD-10-CM | POA: Diagnosis not present

## 2019-03-28 DIAGNOSIS — R059 Cough, unspecified: Secondary | ICD-10-CM

## 2019-03-28 NOTE — Patient Instructions (Signed)
Tylenol as needed, fluids rest. Avoid contact with others for 10 days.  Test at [facility].  Follow up telemed with me in 3 days.  Return to the clinic or go to the nearest emergency room if any of your symptoms worsen or new symptoms occur.  This information is directly available on the CDC website: RunningShows.co.za.html    Source:CDC Reference to specific commercial products, manufacturers, companies, or trademarks does not constitute its endorsement or recommendation by the [place], [place], or Centers for [employer] and Prevention.

## 2019-03-28 NOTE — Progress Notes (Signed)
Virtual Visit via Telephone Note  I connected with Patrick Abbott on 03/28/19 at 6:13 PM by telephone and verified that I am speaking with the correct person using two identifiers.   I discussed the limitations, risks, security and privacy concerns of performing an evaluation and management service by telephone and the availability of in person appointments. I also discussed with the patient that there may be a patient responsible charge related to this service. The patient expressed understanding and agreed to proceed, consent obtained  Chief complaint:  Cough, sore throat, loss of taste.   History of Present Illness: Patrick Abbott is a 68 y.o. male  Initial symptoms 10/2. Sore throat, cough, loss of taste. Some bodyache, no headaches.  Able to drink fluids.  Not short of breath.  Some subjective f/c, no measured temp.  No sick contacts at home, no known exposure to Covid 19. Wife at home - she feels ok.     Tx: alka seltzer, nyquil, tylenol.     Patient Active Problem List   Diagnosis Date Noted  . Benign prostatic hyperplasia with nocturia 10/04/2015  . Essential hypertension 09/16/2014  . Pure hypercholesterolemia 09/16/2014  . CTS (carpal tunnel syndrome) 02/18/2014   Past Medical History:  Diagnosis Date  . Depression   . Hyperlipidemia   . Hypertension    Past Surgical History:  Procedure Laterality Date  . HERNIA REPAIR     Left   No Known Allergies Prior to Admission medications   Medication Sig Start Date End Date Taking? Authorizing Provider  amLODipine (NORVASC) 5 MG tablet TAKE 1 TABLET(5 MG) BY MOUTH DAILY 02/24/19  Yes Wendie Agreste, MD  aspirin 325 MG tablet Take 325 mg by mouth daily. Reported on 10/04/2015   Yes [provider]  Calcium Carb-Cholecalciferol (CALCIUM 1000 + D PO) Take by mouth.   Yes [provider]  pravastatin (PRAVACHOL) 40 MG tablet TAKE 1 TABLET(40 MG) BY MOUTH DAILY 02/24/19  Yes Wendie Agreste, MD  tamsulosin  (FLOMAX) 0.4 MG CAPS capsule Take 1 capsule (0.4 mg total) by mouth daily. 02/24/19  Yes Wendie Agreste, MD  VITAMIN D, CHOLECALCIFEROL, PO Take 1 tablet by mouth daily.   Yes [provider]   Social History   Socioeconomic History  . Marital status: Married    Spouse name: Not on file  . Number of children: Not on file  . Years of education: Not on file  . Highest education level: Not on file  Occupational History  . Not on file  Social Needs  . Financial resource strain: Not on file  . Food insecurity    Worry: Not on file    Inability: Not on file  . Transportation needs    Medical: Not on file    Non-medical: Not on file  Tobacco Use  . Smoking status: Never Smoker  . Smokeless tobacco: Never Used  Substance and Sexual Activity  . Alcohol use: No  . Drug use: No  . Sexual activity: Yes  Lifestyle  . Physical activity    Days per week: Not on file    Minutes per session: Not on file  . Stress: Not on file  Relationships  . Social Herbalist on phone: Not on file    Gets together: Not on file    Attends religious service: Not on file    Active member of club or organization: Not on file    Attends meetings of clubs or  organizations: Not on file    Relationship status: Not on file  . Intimate partner violence    Fear of current or ex partner: Not on file    Emotionally abused: Not on file    Physically abused: Not on file    Forced sexual activity: Not on file  Other Topics Concern  . Not on file  Social History Narrative   Marital status: married x 32 years; moved from [place] in 1982.      Children: 3 children; 1 grandchild.      Employment:  Water engineer.      Tobacco: none      Alcohol:  Once per month      Exercise:  Weights, walking.     Observations/Objective: No distress, speaking in full sentences without distress.   Assessment and Plan: Sore throat - Plan: Novel Coronavirus, NAA ([facility])  Cough - Plan: Novel  Coronavirus, NAA ([facility])  Suspected COVID-19 virus infection - Plan: Novel Coronavirus, NAA ([facility])  Concerning for possible Covid 19 infection. Mild symptoms at present. Risk of complication score 3.   - symptomatic care with tylenol, fluids, rest, ER precautions.   - self isolation discussed  - outpatient testing tomorrow.   Follow Up Instructions: 3 days - telemed.   Patient Instructions  Tylenol as needed, fluids rest. Avoid contact with others for 10 days.  Test at [facility].  Follow up telemed with me in 3 days.  Return to the clinic or go to the nearest emergency room if any of your symptoms worsen or new symptoms occur.  This information is directly available on the CDC website: RunningShows.co.za.html    Source:CDC Reference to specific commercial products, manufacturers, companies, or trademarks does not constitute its endorsement or recommendation by the [place], [place], or Centers for [employer] and Prevention.      I discussed the assessment and treatment plan with the patient. The patient was provided an opportunity to ask questions and all were answered. The patient agreed with the plan and demonstrated an understanding of the instructions.   The patient was advised to call back or seek an in-person evaluation if the symptoms worsen or if the condition fails to improve as anticipated.  I provided 14 minutes of non-face-to-face time during this encounter.  Signed,   [name], MD [facility].  03/28/19

## 2019-03-28 NOTE — Progress Notes (Signed)
CC- cough, sore throat, congestion,Loss of taste- This has been going on since Friday 03/25/2019.

## 2019-03-29 ENCOUNTER — Other Ambulatory Visit: Payer: Self-pay

## 2019-03-29 DIAGNOSIS — Z20828 Contact with and (suspected) exposure to other viral communicable diseases: Secondary | ICD-10-CM | POA: Diagnosis not present

## 2019-03-29 DIAGNOSIS — Z20822 Contact with and (suspected) exposure to covid-19: Secondary | ICD-10-CM

## 2019-03-31 ENCOUNTER — Other Ambulatory Visit: Payer: Self-pay

## 2019-03-31 ENCOUNTER — Telehealth: Payer: Self-pay | Admitting: Family Medicine

## 2019-03-31 ENCOUNTER — Telehealth (INDEPENDENT_AMBULATORY_CARE_PROVIDER_SITE_OTHER): Payer: Medicare HMO | Admitting: Family Medicine

## 2019-03-31 DIAGNOSIS — J029 Acute pharyngitis, unspecified: Secondary | ICD-10-CM

## 2019-03-31 DIAGNOSIS — R059 Cough, unspecified: Secondary | ICD-10-CM

## 2019-03-31 DIAGNOSIS — R05 Cough: Secondary | ICD-10-CM

## 2019-03-31 DIAGNOSIS — Z20822 Contact with and (suspected) exposure to covid-19: Secondary | ICD-10-CM

## 2019-03-31 DIAGNOSIS — Z20828 Contact with and (suspected) exposure to other viral communicable diseases: Secondary | ICD-10-CM

## 2019-03-31 LAB — NOVEL CORONAVIRUS, NAA: SARS-CoV-2, NAA: DETECTED — AB

## 2019-03-31 NOTE — Telephone Encounter (Signed)
03/31/2019 - PATIENT HAD A TELEMED VISIT WITH DR. Carlota Raspberry ON Thursday (03/31/2019) FOR SUSPECTED COVID 19. DR. Carlota Raspberry HAS REQUESTED HE HAVE ANOTHER TELEMED WITH HIM ON Monday (04/04/2019. I TRIED TO CALL AND SCHEDULE BUT HAD TO LEAVE A VOICE MAIL TO RETURN MY CALL. [place]

## 2019-03-31 NOTE — Progress Notes (Signed)
Virtual Visit via Telephone Note  I connected with Patrick Abbott on 03/31/19 at 5:27 PM by telephone and verified that I am speaking with the correct person using two identifiers.   I discussed the limitations, risks, security and privacy concerns of performing an evaluation and management service by telephone and the availability of in person appointments. I also discussed with the patient that there may be a patient responsible charge related to this service. The patient expressed understanding and agreed to proceed, consent obtained  Chief complaint: Cough, st, suspected Covid.   History of Present Illness: Patrick Abbott is a 68 y.o. male  Follow-up from telemedicine visit 3 days ago. Initial sx's on 10/2.  Symptoms concerning for possible COVID-19 infection at that time with sore throat, cough, loss of taste, body ache headache. Symptomatic care discussed.  COVID testing performed 2 days ago.  Results still pending.  Feels alright - a little better than few days ago. Overall improved vs. Last visit.  Had fever few days ago - subjective, but none today.  Still some cough. Itchy throat.  Remaining at home.  Drinking sufficient fluids, UOP 4-5 today.  Eating ok - less appetite due to taste in mouth.  Denies dyspnea, confusion, near syncope.   Tx: nyquil. Tylenol.      Patient Active Problem List   Diagnosis Date Noted  . Benign prostatic hyperplasia with nocturia 10/04/2015  . Essential hypertension 09/16/2014  . Pure hypercholesterolemia 09/16/2014  . CTS (carpal tunnel syndrome) 02/18/2014   Past Medical History:  Diagnosis Date  . Depression   . Hyperlipidemia   . Hypertension    Past Surgical History:  Procedure Laterality Date  . HERNIA REPAIR     Left   No Known Allergies Prior to Admission medications   Medication Sig Start Date End Date Taking? Authorizing Provider  amLODipine (NORVASC) 5 MG tablet TAKE 1 TABLET(5 MG) BY MOUTH DAILY 02/24/19  Yes Wendie Agreste, MD  aspirin 325 MG tablet Take 325 mg by mouth daily. Reported on 10/04/2015   Yes [provider]  Calcium Carb-Cholecalciferol (CALCIUM 1000 + D PO) Take by mouth.   Yes [provider]  pravastatin (PRAVACHOL) 40 MG tablet TAKE 1 TABLET(40 MG) BY MOUTH DAILY 02/24/19  Yes Wendie Agreste, MD  tamsulosin (FLOMAX) 0.4 MG CAPS capsule Take 1 capsule (0.4 mg total) by mouth daily. 02/24/19  Yes Wendie Agreste, MD  VITAMIN D, CHOLECALCIFEROL, PO Take 1 tablet by mouth daily.   Yes [provider]   Social History   Socioeconomic History  . Marital status: Married    Spouse name: Not on file  . Number of children: Not on file  . Years of education: Not on file  . Highest education level: Not on file  Occupational History  . Not on file  Social Needs  . Financial resource strain: Not on file  . Food insecurity    Worry: Not on file    Inability: Not on file  . Transportation needs    Medical: Not on file    Non-medical: Not on file  Tobacco Use  . Smoking status: Never Smoker  . Smokeless tobacco: Never Used  Substance and Sexual Activity  . Alcohol use: No  . Drug use: No  . Sexual activity: Yes  Lifestyle  . Physical activity    Days per week: Not on file    Minutes per session: Not on file  . Stress: Not on file  Relationships  . Social Herbalist on phone: Not on file    Gets together: Not on file    Attends religious service: Not on file    Active member of club or organization: Not on file    Attends meetings of clubs or organizations: Not on file    Relationship status: Not on file  . Intimate partner violence    Fear of current or ex partner: Not on file    Emotionally abused: Not on file    Physically abused: Not on file    Forced sexual activity: Not on file  Other Topics Concern  . Not on file  Social History Narrative   Marital status: married x 32 years; moved from [place] in 1982.      Children: 3 children; 1  grandchild.      Employment:  Water engineer.      Tobacco: none      Alcohol:  Once per month      Exercise:  Weights, walking.     Observations/Objective: There were no vitals filed for this visit. Unable to check temp at home.  No vitals obtained.  Speaking in full sentences, few coughs during visit but no respiratory distress, appropriate responses.  Understanding expressed of plan with all questions answered  Assessment and Plan: Cough  Sore throat  Suspected COVID-19 virus infection Improving.  Denies dyspnea.  Reports adequate hydration.  Has been isolating at home.  Results of test pending, but still suspicious for COVID-19.  Continued symptomatic care discussed with recheck in 4 days.  If improving at that time, would be able to return to work the following day as would have been 10 days since onset of symptoms.  ER/urgent care precautions given over the weekend if worsening  Follow Up Instructions: 4 days, ER precautions/urgent care precautions of or sooner   I discussed the assessment and treatment plan with the patient. The patient was provided an opportunity to ask questions and all were answered. The patient agreed with the plan and demonstrated an understanding of the instructions.   The patient was advised to call back or seek an in-person evaluation if the symptoms worsen or if the condition fails to improve as anticipated.  I provided 6 minutes of non-face-to-face time during this encounter.  Signed,   [name], MD [facility].  03/31/19

## 2019-03-31 NOTE — Progress Notes (Signed)
CC- covid- cough, sore throat, lost of taste, little bodyache, had a fever a few days ago but not at this moment. For 1 week. Was tested for covid on 03/29/2019 they suppose to call me today to let me the results.

## 2019-04-01 ENCOUNTER — Telehealth: Payer: Self-pay | Admitting: Family Medicine

## 2019-04-01 NOTE — Telephone Encounter (Signed)
Called pt, discussed results. Isolation continued. Sx care, and ER precautions discussed as well as recommendations for family/close contacts. Understanding expressed.

## 2019-04-01 NOTE — Telephone Encounter (Signed)
Jane at [facility] wanted to let Dr. Carlota Raspberry know that his pts Covid-19 test are Positive .   FR

## 2019-04-06 ENCOUNTER — Other Ambulatory Visit: Payer: Self-pay

## 2019-04-06 ENCOUNTER — Telehealth (INDEPENDENT_AMBULATORY_CARE_PROVIDER_SITE_OTHER): Payer: Medicare HMO | Admitting: Family Medicine

## 2019-04-06 DIAGNOSIS — R05 Cough: Secondary | ICD-10-CM

## 2019-04-06 DIAGNOSIS — U071 COVID-19: Secondary | ICD-10-CM | POA: Diagnosis not present

## 2019-04-06 DIAGNOSIS — R059 Cough, unspecified: Secondary | ICD-10-CM

## 2019-04-06 NOTE — Progress Notes (Signed)
CC- Covid f/u- Still have a little cough, little mucus better. No Fever, no body aches.

## 2019-04-06 NOTE — Progress Notes (Signed)
Virtual Visit via Telephone Note 3:45 PM Called. No answer. Left VM that I will call back.   I connected with Patrick Abbott on 04/06/19 at 4:06 PM by telephone and verified that I am speaking with the correct person using two identifiers.   I discussed the limitations, risks, security and privacy concerns of performing an evaluation and management service by telephone and the availability of in person appointments. I also discussed with the patient that there may be a patient responsible charge related to this service. The patient expressed understanding and agreed to proceed, consent obtained  Chief complaint:  Covid 19.   History of Present Illness: Patrick Abbott is a 68 y.o. male   Initial telemedicine visit for sore throat, cough, loss of taste on October 5 with initial symptoms October 2nd.  No dyspnea, subjective fever and chills, no known exposure to COVID-19.  Initial symptomatic care discussed with self isolation.  Follow-up telemedicine call on October 8.  Overall symptoms were improved.  Subjective fever few days prior but none on the eighth.  Minimal cough, itchy throat.  Hydrating sufficiently without dyspnea, confusion, or near syncopal symptoms.  Positive COVID-19 test results noted and discussed with patient on October 9.  Feeling better, but still with cough and congestion.  Overall better.  No dyspnea.  No fever, confusion or near syncope. Slight dizzy with standing up fast. Drinking fluids, and eating ok - appetite is improving. Overall better than 5 days ago. Clear mucus.  Tx: nyquil flu.   Planned RTW on 10/19.  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    Patient Active Problem List   Diagnosis Date Noted  . Benign prostatic hyperplasia with nocturia 10/04/2015  . Essential hypertension 09/16/2014  . Pure hypercholesterolemia 09/16/2014  . CTS (carpal tunnel syndrome) 02/18/2014   Past Medical History:  Diagnosis Date   . Depression   . Hyperlipidemia   . Hypertension    Past Surgical History:  Procedure Laterality Date  . HERNIA REPAIR     Left   No Known Allergies Prior to Admission medications   Medication Sig Start Date End Date Taking? Authorizing Provider  amLODipine (NORVASC) 5 MG tablet TAKE 1 TABLET(5 MG) BY MOUTH DAILY 02/24/19  Yes Wendie Agreste, MD  aspirin 325 MG tablet Take 325 mg by mouth daily. Reported on 10/04/2015   Yes [provider]  Calcium Carb-Cholecalciferol (CALCIUM 1000 + D PO) Take by mouth.   Yes [provider]  pravastatin (PRAVACHOL) 40 MG tablet TAKE 1 TABLET(40 MG) BY MOUTH DAILY 02/24/19  Yes Wendie Agreste, MD  tamsulosin (FLOMAX) 0.4 MG CAPS capsule Take 1 capsule (0.4 mg total) by mouth daily. 02/24/19  Yes Wendie Agreste, MD  VITAMIN D, CHOLECALCIFEROL, PO Take 1 tablet by mouth daily.   Yes [provider]   Social History   Socioeconomic History  . Marital status: Married    Spouse name: Not on file  . Number of children: Not on file  . Years of education: Not on file  . Highest education level: Not on file  Occupational History  . Not on file  Social Needs  . Financial resource strain: Not on file  . Food insecurity    Worry: Not on file    Inability: Not on file  . Transportation needs    Medical: Not on file    Non-medical: Not on file  Tobacco Use  . Smoking status: Never Smoker  .  Smokeless tobacco: Never Used  Substance and Sexual Activity  . Alcohol use: No  . Drug use: No  . Sexual activity: Yes  Lifestyle  . Physical activity    Days per week: Not on file    Minutes per session: Not on file  . Stress: Not on file  Relationships  . Social Herbalist on phone: Not on file    Gets together: Not on file    Attends religious service: Not on file    Active member of club or organization: Not on file    Attends meetings of clubs or organizations: Not on file    Relationship status: Not on file   . Intimate partner violence    Fear of current or ex partner: Not on file    Emotionally abused: Not on file    Physically abused: Not on file    Forced sexual activity: Not on file  Other Topics Concern  . Not on file  Social History Narrative   Marital status: married x 32 years; moved from [place] in 1982.      Children: 3 children; 1 grandchild.      Employment:  Water engineer.      Tobacco: none      Alcohol:  Once per month      Exercise:  Weights, walking.     Observations/Objective: Speaking normally, full sentences, no respiratory distress.  Appropriate responses.  All questions answered with understanding of plan expressed  Assessment and Plan: Cough  COVID-19 COVID-19 infection with mild to moderate symptoms.  No respiratory distress, remaining hydrated, and has had continual symptom improvement.  Residual symptom of cough and congestion, fleeting lightheadedness with standing up quickly but no near syncope/syncopal symptoms.  Initial symptoms started 12 days ago.  -Continued symptomatic care discussed.  If symptoms continue to improve over the next 5 days, then can return to work as planned Monday the 19th.  If not continuing to improve, or any return of lightheadedness, should stay out of work until we have a follow-up early next week.  ER precautions given if any worsening symptoms.  Follow Up Instructions: Early next week if needed/not improving.   I discussed the assessment and treatment plan with the patient. The patient was provided an opportunity to ask questions and all were answered. The patient agreed with the plan and demonstrated an understanding of the instructions.   The patient was advised to call back or seek an in-person evaluation if the symptoms worsen or if the condition fails to improve as anticipated.  I provided 9 minutes of non-face-to-face time during this encounter.  Signed,   [name], MD [facility].  04/06/19

## 2019-04-22 DIAGNOSIS — Z20828 Contact with and (suspected) exposure to other viral communicable diseases: Secondary | ICD-10-CM | POA: Diagnosis not present

## 2019-09-05 ENCOUNTER — Telehealth: Payer: Self-pay | Admitting: Family Medicine

## 2019-10-03 ENCOUNTER — Other Ambulatory Visit: Payer: Self-pay | Admitting: Emergency Medicine

## 2019-10-03 ENCOUNTER — Other Ambulatory Visit: Payer: Self-pay | Admitting: Family Medicine

## 2019-10-03 DIAGNOSIS — I10 Essential (primary) hypertension: Secondary | ICD-10-CM

## 2019-10-03 DIAGNOSIS — R351 Nocturia: Secondary | ICD-10-CM

## 2019-10-03 DIAGNOSIS — E7800 Pure hypercholesterolemia, unspecified: Secondary | ICD-10-CM

## 2019-10-03 DIAGNOSIS — N401 Enlarged prostate with lower urinary tract symptoms: Secondary | ICD-10-CM

## 2019-10-03 NOTE — Telephone Encounter (Signed)
Requested Prescriptions  Pending Prescriptions Disp Refills  . amLODipine (NORVASC) 5 MG tablet [Pharmacy Med Name: AMLODIPINE BESYLATE 5 MG Tablet] 90 tablet 1    Sig: TAKE 1 TABLET(5 MG) BY MOUTH DAILY     Cardiovascular:  Calcium Channel Blockers Passed - 10/03/2019  6:53 PM      Passed - Last BP in normal range    BP Readings from Last 1 Encounters:  02/24/19 116/62         Passed - Valid encounter within last 6 months    Recent Outpatient Visits          6 months ago Cough   [facility], Ranell Patrick, MD   6 months ago Cough   [facility], Ranell Patrick, MD   6 months ago Sore throat   [facility], Ranell Patrick, MD   7 months ago Medicare annual wellness visit, subsequent   [facility], Ranell Patrick, MD   1 year ago Essential hypertension   [facility], Ranell Patrick, MD             . pravastatin (PRAVACHOL) 40 MG tablet [Pharmacy Med Name: PRAVASTATIN SODIUM 40 MG Tablet] 90 tablet 3    Sig: TAKE 1 TABLET(40 MG) BY MOUTH DAILY     Cardiovascular:  Antilipid - Statins Failed - 10/03/2019  6:53 PM      Failed - LDL in normal range and within 360 days    LDL Chol Calc (NIH)  Date Value Ref Range Status  02/24/2019 83 0 - 99 mg/dL Final         Passed - Total Cholesterol in normal range and within 360 days    Cholesterol, Total  Date Value Ref Range Status  02/24/2019 149 100 - 199 mg/dL Final         Passed - HDL in normal range and within 360 days    HDL  Date Value Ref Range Status  02/24/2019 41 >39 mg/dL Final         Passed - Triglycerides in normal range and within 360 days    Triglycerides  Date Value Ref Range Status  02/24/2019 143 0 - 149 mg/dL Final         Passed - Patient is not pregnant      Passed - Valid encounter within last 12 months    Recent Outpatient Visits          6 months ago Cough   [facility], Ranell Patrick, MD   6 months ago Cough   [facility], Ranell Patrick, MD   6 months ago Sore throat   [facility], Ranell Patrick, MD   7 months ago Medicare annual wellness visit, subsequent   [facility], Ranell Patrick, MD   1 year ago Essential hypertension   [facility], Ranell Patrick, MD

## 2019-10-03 NOTE — Telephone Encounter (Signed)
Requested Prescriptions  Pending Prescriptions Disp Refills  . tamsulosin (FLOMAX) 0.4 MG CAPS capsule [Pharmacy Med Name: TAMSULOSIN HYDROCHLORIDE 0.4 MG Capsule] 90 capsule 1    Sig: TAKE 1 CAPSULE (0.4 MG TOTAL) BY MOUTH DAILY.     Urology: Alpha-Adrenergic Blocker Passed - 10/03/2019  6:53 PM      Passed - Last BP in normal range    BP Readings from Last 1 Encounters:  02/24/19 116/62         Passed - Valid encounter within last 12 months    Recent Outpatient Visits          6 months ago Cough   [facility], Ranell Patrick, MD   6 months ago Cough   [facility], Ranell Patrick, MD   6 months ago Sore throat   [facility], Ranell Patrick, MD   7 months ago Medicare annual wellness visit, subsequent   [facility], Ranell Patrick, MD   1 year ago Essential hypertension   [facility], Ranell Patrick, MD

## 2020-03-14 ENCOUNTER — Other Ambulatory Visit: Payer: Self-pay | Admitting: Family Medicine

## 2020-03-14 DIAGNOSIS — R351 Nocturia: Secondary | ICD-10-CM

## 2020-03-14 DIAGNOSIS — N401 Enlarged prostate with lower urinary tract symptoms: Secondary | ICD-10-CM

## 2020-03-14 DIAGNOSIS — I10 Essential (primary) hypertension: Secondary | ICD-10-CM

## 2020-03-14 DIAGNOSIS — E7800 Pure hypercholesterolemia, unspecified: Secondary | ICD-10-CM

## 2020-03-14 MED ORDER — PRAVASTATIN SODIUM 40 MG PO TABS: ORAL_TABLET | ORAL | 0 refills | Status: DC

## 2020-03-14 MED ORDER — TAMSULOSIN HCL 0.4 MG PO CAPS: 0.4000 mg | ORAL_CAPSULE | Freq: Every day | ORAL | 0 refills | Status: DC

## 2020-03-14 NOTE — Telephone Encounter (Signed)
Medication Refill - Medication: pravastatin (PRAVACHOL) 40 MG tablet  amLODipine (NORVASC) 5 MG tablet  tamsulosin (FLOMAX) 0.4 MG CAPS capsule    Has the patient contacted their pharmacy? Yes.   (Agent: If no, request that the patient contact the pharmacy for the refill.) (Agent: If yes, when and what did the pharmacy advise?)  Preferred Pharmacy (with phone number or street name):  [facility] - [place], [place] - [address]  [address] [place] [address]  Phone: [phone] Fax: [phone]     Agent: Please be advised that RX refills may take up to 3 business days. We ask that you follow-up with your pharmacy.  

## 2020-03-15 MED ORDER — AMLODIPINE BESYLATE 5 MG PO TABS: ORAL_TABLET | ORAL | 0 refills | Status: DC

## 2020-04-27 ENCOUNTER — Other Ambulatory Visit: Payer: Self-pay | Admitting: Family Medicine

## 2020-04-27 DIAGNOSIS — I10 Essential (primary) hypertension: Secondary | ICD-10-CM

## 2020-04-27 DIAGNOSIS — N401 Enlarged prostate with lower urinary tract symptoms: Secondary | ICD-10-CM

## 2020-04-27 DIAGNOSIS — E7800 Pure hypercholesterolemia, unspecified: Secondary | ICD-10-CM

## 2020-04-27 NOTE — Telephone Encounter (Signed)
Medication Refill - Medication: pravastatin (PRAVACHOL) 40 MG tablet  amLODipine (NORVASC) 5 MG tablet  tamsulosin (FLOMAX) 0.4 MG CAPS capsule    Has the patient contacted their pharmacy? Yes.   (Agent: If no, request that the patient contact the pharmacy for the refill.) (Agent: If yes, when and what did the pharmacy advise?)  Preferred Pharmacy (with phone number or street name):  [facility], [place]  [place] [address]  Phone: [phone] Fax: [phone]     Agent: Please be advised that RX refills may take up to 3 business days. We ask that you follow-up with your pharmacy.

## 2020-05-07 ENCOUNTER — Other Ambulatory Visit: Payer: Self-pay

## 2020-05-07 ENCOUNTER — Ambulatory Visit (INDEPENDENT_AMBULATORY_CARE_PROVIDER_SITE_OTHER): Payer: Medicare HMO | Admitting: Family Medicine

## 2020-05-07 ENCOUNTER — Encounter: Payer: Self-pay | Admitting: Family Medicine

## 2020-05-07 VITALS — BP 130/75 | HR 68 | Temp 97.5°F | Ht 64.0 in | Wt 141.0 lb

## 2020-05-07 DIAGNOSIS — I10 Essential (primary) hypertension: Secondary | ICD-10-CM

## 2020-05-07 DIAGNOSIS — N401 Enlarged prostate with lower urinary tract symptoms: Secondary | ICD-10-CM | POA: Diagnosis not present

## 2020-05-07 DIAGNOSIS — Z23 Encounter for immunization: Secondary | ICD-10-CM | POA: Diagnosis not present

## 2020-05-07 DIAGNOSIS — R351 Nocturia: Secondary | ICD-10-CM | POA: Diagnosis not present

## 2020-05-07 DIAGNOSIS — E785 Hyperlipidemia, unspecified: Secondary | ICD-10-CM | POA: Diagnosis not present

## 2020-05-07 DIAGNOSIS — E7800 Pure hypercholesterolemia, unspecified: Secondary | ICD-10-CM

## 2020-05-07 MED ORDER — PRAVASTATIN SODIUM 40 MG PO TABS: ORAL_TABLET | ORAL | 1 refills | Status: DC

## 2020-05-07 MED ORDER — AMLODIPINE BESYLATE 5 MG PO TABS: ORAL_TABLET | ORAL | 1 refills | Status: DC

## 2020-05-07 MED ORDER — TAMSULOSIN HCL 0.4 MG PO CAPS: 0.4000 mg | ORAL_CAPSULE | Freq: Every day | ORAL | 1 refills | Status: DC

## 2020-05-07 NOTE — Patient Instructions (Addendum)
     If you have lab work done today you will be contacted with your lab results within the next 2 weeks.  If you have not heard from [place] then please contact us. The fastest way to get your results is to register for My Chart.   IF you received an x-ray today, you will receive an invoice from [facility]. Please contact [facility] at [phone] with questions or concerns regarding your invoice.   IF you received labwork today, you will receive an invoice from [facility]. Please contact [facility] at [phone] with questions or concerns regarding your invoice.   Our billing staff will not be able to assist you with questions regarding bills from these companies.  You will be contacted with the lab results as soon as they are available. The fastest way to get your results is to activate your My Chart account. Instructions are located on the last page of this paperwork. If you have not heard from [place] regarding the results in 2 weeks, please contact this office.    No med changes for now. Return in 6 months for recheck, sooner if new symptoms.

## 2020-05-07 NOTE — Progress Notes (Signed)
Subjective:  Patient ID: Patrick Abbott, male    DOB: 07/14/1950  Age: 69 y.o. MRN: [id number]  CC:  Chief Complaint  Patient presents with  . medication review    Pt reports he takes his medication as prescribed with no side effects. pt states no issues with his current medication/treatment.    HPI Patrick Abbott presents for   Hypertension: Amlodipine 5mg  qd.  Home readings: 120/60-80.  BP Readings from Last 3 Encounters:  05/07/20 130/75  02/24/19 116/62  12/29/18 108/66   Lab Results  Component Value Date   CREATININE 1.11 02/24/2019   Hyperlipidemia: Pravastatin 40mg  qd. No new myalgias or side effects. Fasting today.   Lab Results  Component Value Date   CHOL 149 02/24/2019   HDL 41 02/24/2019   LDLCALC 83 02/24/2019   TRIG 143 02/24/2019   CHOLHDL 3.6 02/24/2019   Lab Results  Component Value Date   ALT 15 02/24/2019   AST 17 02/24/2019   ALKPHOS 58 02/24/2019   BILITOT 0.4 02/24/2019    Immunization History  Administered Date(s) Administered  . Fluad Quad(high Dose 65+) 02/24/2019, 05/07/2020  . Influenza, High Dose Seasonal PF 08/27/2018  . Influenza,inj,Quad PF,6+ Mos 04/10/2015, 06/02/2016, 06/12/2017  . Moderna SARS-COVID-2 Vaccination 08/06/2019, 09/03/2019  . PFIZER SARS-COV-2 Vaccination 08/06/2019  . Pneumococcal Conjugate-13 10/04/2015  . Pneumococcal Polysaccharide-23 08/27/2018  . Tdap 12/23/2012  . Zoster 09/16/2014  had moderna covid vaccine and booster. 2/13, 3/13, 04/21/20 - Moderna booster.   BPH with LUTS, prior nocturia.  Nocturia  - up to 3 -4 times.  Controlled with flomax daily. Urinating well.   History Patient Active Problem List   Diagnosis Date Noted  . Benign prostatic hyperplasia with nocturia 10/04/2015  . Essential hypertension 09/16/2014  . Pure hypercholesterolemia 09/16/2014  . CTS (carpal tunnel syndrome) 02/18/2014   Past Medical History:  Diagnosis Date  . Depression   . Hyperlipidemia   . Hypertension     Past Surgical History:  Procedure Laterality Date  . HERNIA REPAIR     Left   No Known Allergies Prior to Admission medications   Medication Sig Start Date End Date Taking? Authorizing Provider  amLODipine (NORVASC) 5 MG tablet TAKE 1 TABLET(5 MG) BY MOUTH DAILY 03/15/20  Yes Wendie Agreste, MD  aspirin 325 MG tablet Take 325 mg by mouth daily. Reported on 10/04/2015   Yes [provider]  Calcium Carb-Cholecalciferol (CALCIUM 1000 + D PO) Take by mouth.   Yes [provider]  pravastatin (PRAVACHOL) 40 MG tablet TAKE 1 TABLET(40 MG) BY MOUTH DAILY 03/14/20  Yes Wendie Agreste, MD  tamsulosin (FLOMAX) 0.4 MG CAPS capsule Take 1 capsule (0.4 mg total) by mouth daily. 03/14/20  Yes Wendie Agreste, MD  VITAMIN D, CHOLECALCIFEROL, PO Take 1 tablet by mouth daily.   Yes [provider]   Social History   Socioeconomic History  . Marital status: Married    Spouse name: Not on file  . Number of children: Not on file  . Years of education: Not on file  . Highest education level: Not on file  Occupational History  . Not on file  Tobacco Use  . Smoking status: Never Smoker  . Smokeless tobacco: Never Used  Vaping Use  . Vaping Use: Never used  Substance and Sexual Activity  . Alcohol use: No  . Drug use: No  . Sexual activity: Yes  Other Topics Concern  . Not on file  Social History Narrative  Marital status: married x 32 years; moved from [place] in 1982.      Children: 3 children; 1 grandchild.      Employment:  Water engineer.      Tobacco: none      Alcohol:  Once per month      Exercise:  Weights, walking.   Social Determinants of Health   Financial Resource Strain:   . Difficulty of Paying Living Expenses: Not on file  Food Insecurity:   . Worried About Charity fundraiser in the Last Year: Not on file  . Ran Out of Food in the Last Year: Not on file  Transportation Needs:   . Lack of Transportation (Medical): Not on file  .  Lack of Transportation (Non-Medical): Not on file  Physical Activity:   . Days of Exercise per Week: Not on file  . Minutes of Exercise per Session: Not on file  Stress:   . Feeling of Stress : Not on file  Social Connections:   . Frequency of Communication with Friends and Family: Not on file  . Frequency of Social Gatherings with Friends and Family: Not on file  . Attends Religious Services: Not on file  . Active Member of Clubs or Organizations: Not on file  . Attends Archivist Meetings: Not on file  . Marital Status: Not on file  Intimate Partner Violence:   . Fear of Current or Ex-Partner: Not on file  . Emotionally Abused: Not on file  . Physically Abused: Not on file  . Sexually Abused: Not on file    Review of Systems  Constitutional: Negative for fatigue and unexpected weight change.  Eyes: Negative for visual disturbance.  Respiratory: Negative for cough, chest tightness and shortness of breath.   Cardiovascular: Negative for chest pain, palpitations and leg swelling.  Gastrointestinal: Negative for abdominal pain and blood in stool.  Neurological: Negative for dizziness, light-headedness and headaches.     Objective:   Vitals:   05/07/20 1111  BP: 130/75  Pulse: 68  Temp: (!) 97.5 F (36.4 C)  TempSrc: Temporal  SpO2: 98%  Weight: 141 lb (64 kg)  Height: 5\' 4"  (1.626 m)     Physical Exam Vitals reviewed.  Constitutional:      Appearance: He is well-developed.  HENT:     Head: Normocephalic and atraumatic.  Eyes:     Pupils: Pupils are equal, round, and reactive to light.  Neck:     Vascular: No carotid bruit or JVD.  Cardiovascular:     Rate and Rhythm: Normal rate and regular rhythm.     Heart sounds: Normal heart sounds. No murmur heard.   Pulmonary:     Effort: Pulmonary effort is normal.     Breath sounds: Normal breath sounds. No rales.  Skin:    General: Skin is warm and dry.  Neurological:     Mental Status: He is alert and  oriented to person, place, and time.        Assessment & Plan:  Patrick Abbott is a 69 y.o. male . Essential hypertension - Plan: Lipid panel, Comprehensive metabolic panel, amLODipine (NORVASC) 5 MG tablet  -  Stable, tolerating current regimen. Medications refilled. Labs pending as above.   Need for vaccination - Plan: Flu Vaccine QUAD High Dose(Fluad)  Hyperlipidemia, unspecified hyperlipidemia type - Plan: Lipid panel, Comprehensive metabolic panel Pure hypercholesterolemia - Plan: pravastatin (PRAVACHOL) 40 MG tablet   -  Stable, tolerating current regimen. Medications refilled. Labs pending  as above.   Benign prostatic hyperplasia with nocturia - Plan: tamsulosin (FLOMAX) 0.4 MG CAPS capsule  - reports stable sx's, but fluid restriction just prior to bedtime with daytime hydration recommended, rtc precautions.   Meds ordered this encounter  Medications  . amLODipine (NORVASC) 5 MG tablet    Sig: TAKE 1 TABLET(5 MG) BY MOUTH DAILY    Dispense:  90 tablet    Refill:  1  . pravastatin (PRAVACHOL) 40 MG tablet    Sig: TAKE 1 TABLET(40 MG) BY MOUTH DAILY    Dispense:  90 tablet    Refill:  1  . tamsulosin (FLOMAX) 0.4 MG CAPS capsule    Sig: Take 1 capsule (0.4 mg total) by mouth daily.    Dispense:  90 capsule    Refill:  1   Patient Instructions       If you have lab work done today you will be contacted with your lab results within the next 2 weeks.  If you have not heard from [place] then please contact us. The fastest way to get your results is to register for My Chart.   IF you received an x-ray today, you will receive an invoice from [facility]. Please contact [facility] at [phone] with questions or concerns regarding your invoice.   IF you received labwork today, you will receive an invoice from [facility]. Please contact [facility] at [phone] with questions or concerns regarding your invoice.   Our billing staff will not be able to assist  you with questions regarding bills from these companies.  You will be contacted with the lab results as soon as they are available. The fastest way to get your results is to activate your My Chart account. Instructions are located on the last page of this paperwork. If you have not heard from [place] regarding the results in 2 weeks, please contact this office.    No med changes for now. Return in 6 months for recheck, sooner if new symptoms.     Signed, [name], MD [facility]

## 2020-05-08 LAB — COMPREHENSIVE METABOLIC PANEL
ALT: 24 IU/L (ref 0–44)
AST: 21 IU/L (ref 0–40)
Albumin/Globulin Ratio: 1.3 (ref 1.2–2.2)
Albumin: 4.6 g/dL (ref 3.8–4.8)
Alkaline Phosphatase: 80 IU/L (ref 44–121)
BUN/Creatinine Ratio: 10 (ref 10–24)
BUN: 11 mg/dL (ref 8–27)
Bilirubin Total: 0.5 mg/dL (ref 0.0–1.2)
CO2: 27 mmol/L (ref 20–29)
Calcium: 9.3 mg/dL (ref 8.6–10.2)
Chloride: 103 mmol/L (ref 96–106)
Creatinine, Ser: 1.11 mg/dL (ref 0.76–1.27)
GFR calc Af Amer: 78 mL/min/{1.73_m2} (ref >59)
GFR calc non Af Amer: 67 mL/min/{1.73_m2} (ref >59)
Globulin, Total: 3.5 g/dL (ref 1.5–4.5)
Glucose: 94 mg/dL (ref 65–99)
Potassium: 4.4 mmol/L (ref 3.5–5.2)
Sodium: 142 mmol/L (ref 134–144)
Total Protein: 8.1 g/dL (ref 6.0–8.5)

## 2020-05-08 LAB — LIPID PANEL
Chol/HDL Ratio: 5.3 ratio — ABNORMAL HIGH (ref 0.0–5.0)
Cholesterol, Total: 217 mg/dL — ABNORMAL HIGH (ref 100–199)
HDL: 41 mg/dL (ref >39)
LDL Chol Calc (NIH): 130 mg/dL — ABNORMAL HIGH (ref 0–99)
Triglycerides: 260 mg/dL — ABNORMAL HIGH (ref 0–149)
VLDL Cholesterol Cal: 46 mg/dL — ABNORMAL HIGH (ref 5–40)

## 2020-06-26 ENCOUNTER — Ambulatory Visit (INDEPENDENT_AMBULATORY_CARE_PROVIDER_SITE_OTHER): Payer: Medicare HMO | Admitting: Family Medicine

## 2020-06-26 VITALS — BP 130/75 | Ht 64.0 in | Wt 141.0 lb

## 2020-06-26 DIAGNOSIS — Z0000 Encounter for general adult medical examination without abnormal findings: Secondary | ICD-10-CM | POA: Diagnosis not present

## 2020-06-26 NOTE — Progress Notes (Signed)
Presents today for [employer] Visit   Date of last exam:05-07-2020   Interpreter used for this visit? No  I connected with  Franne Grip on 06/26/20 by a telephone and verified that I am speaking with the correct person using two identifiers.   I discussed the limitations of evaluation and management by telemedicine. The patient expressed understanding and agreed to proceed.  Patient location: home  Provider location: in office  I provided 20 minutes of non face - to - face time during this encounter.  Patient Care Team: Wendie Agreste, MD as PCP - General (Family Medicine)   Other items to address today:   Discussed Eye/Dental Discussed Immunizations Follow up Dr. Carlota Raspberry 5-16 @ 8:20  Other Screening: Last screening for diabetes: 05/07/2020 Last lipid screening: 05/07/2020  ADVANCE DIRECTIVES: Discussed: yes On File: no Materials Provided: yes  Immunization status:  Immunization History  Administered Date(s) Administered  . Fluad Quad(high Dose 65+) 02/24/2019, 05/07/2020  . Influenza, High Dose Seasonal PF 08/27/2018  . Influenza,inj,Quad PF,6+ Mos 04/10/2015, 06/02/2016, 06/12/2017  . Moderna Sars-Covid-2 Vaccination 08/06/2019, 09/03/2019  . PFIZER SARS-COV-2 Vaccination 08/06/2019  . Pneumococcal Conjugate-13 10/04/2015  . Pneumococcal Polysaccharide-23 08/27/2018  . Tdap 12/23/2012  . Zoster 09/16/2014     Health Maintenance Due  Topic Date Due  . COVID-19 Vaccine (3 - Booster) 03/05/2020     Functional Status Survey: Is the patient deaf or have difficulty hearing?: No Does the patient have difficulty seeing, even when wearing glasses/contacts?: No Does the patient have difficulty concentrating, remembering, or making decisions?: No Does the patient have difficulty walking or climbing stairs?: No Does the patient have difficulty dressing or bathing?: No Does the patient have difficulty doing errands alone such as visiting a  doctor's office or shopping?: No   6CIT Screen 06/26/2020 02/24/2019  What Year? 0 points 0 points  What month? 0 points 0 points  What time? 0 points 0 points  Count back from 20 0 points 0 points  Months in reverse 0 points 0 points  Repeat phrase 0 points 0 points  Total Score 0 0         Home Environment:   No trouble climbing stairs Lives in a split level Yes grab bars No scattered rugs Adequate lighting/ no clutter Patient still works full-time    Patient Active Problem List   Diagnosis Date Noted  . Benign prostatic hyperplasia with nocturia 10/04/2015  . Essential hypertension 09/16/2014  . Pure hypercholesterolemia 09/16/2014  . CTS (carpal tunnel syndrome) 02/18/2014     Past Medical History:  Diagnosis Date  . Depression   . Hyperlipidemia   . Hypertension      Past Surgical History:  Procedure Laterality Date  . HERNIA REPAIR     Left     Family History  Problem Relation Age of Onset  . Colon cancer Neg Hx   . Esophageal cancer Neg Hx   . Rectal cancer Neg Hx   . Stomach cancer Neg Hx      Social History   Socioeconomic History  . Marital status: Married    Spouse name: Not on file  . Number of children: Not on file  . Years of education: Not on file  . Highest education level: Not on file  Occupational History  . Not on file  Tobacco Use  . Smoking status: Never Smoker  . Smokeless tobacco: Never Used  Vaping Use  . Vaping Use: Never used  Substance and Sexual Activity  . Alcohol use: No  . Drug use: No  . Sexual activity: Yes  Other Topics Concern  . Not on file  Social History Narrative   Marital status: married x 32 years; moved from [place] in 1982.      Children: 3 children; 1 grandchild.      Employment:  Optician, dispensing.      Tobacco: none      Alcohol:  Once per month      Exercise:  Weights, walking.   Social Determinants of Health   Financial Resource Strain: Not on file  Food Insecurity: Not on file   Transportation Needs: Not on file  Physical Activity: Not on file  Stress: Not on file  Social Connections: Not on file  Intimate Partner Violence: Not on file     No Known Allergies   Prior to Admission medications   Medication Sig Start Date End Date Taking? Authorizing Provider  amLODipine (NORVASC) 5 MG tablet TAKE 1 TABLET(5 MG) BY MOUTH DAILY 05/07/20  Yes Shade Flood, MD  aspirin 325 MG tablet Take 325 mg by mouth daily. Reported on 10/04/2015   Yes [provider]  Calcium Carb-Cholecalciferol (CALCIUM 1000 + D PO) Take by mouth.   Yes [provider]  pravastatin (PRAVACHOL) 40 MG tablet TAKE 1 TABLET(40 MG) BY MOUTH DAILY 05/07/20  Yes Shade Flood, MD  tamsulosin (FLOMAX) 0.4 MG CAPS capsule Take 1 capsule (0.4 mg total) by mouth daily. 05/07/20  Yes Shade Flood, MD  VITAMIN D, CHOLECALCIFEROL, PO Take 1 tablet by mouth daily.   Yes [provider]     Depression screen [facility] 2/9 06/26/2020 05/07/2020 04/06/2019 02/24/2019 08/27/2018  Decreased Interest 0 0 0 0 0  Down, Depressed, Hopeless 0 0 0 0 0  PHQ - 2 Score 0 0 0 0 0  Altered sleeping - - - - -  Tired, decreased energy - - - - -  Change in appetite - - - - -  Feeling bad or failure about yourself  - - - - -  Trouble concentrating - - - - -  Moving slowly or fidgety/restless - - - - -  Suicidal thoughts - - - - -  PHQ-9 Score - - - - -     Fall Risk  06/26/2020 05/07/2020 04/06/2019 02/24/2019 08/27/2018  Falls in the past year? 0 0 0 0 0  Number falls in past yr: 0 - 0 0 0  Injury with Fall? 0 - 0 0 0  Follow up Falls evaluation completed;Education provided Falls evaluation completed - - Falls evaluation completed      PHYSICAL EXAM: BP 130/75 Comment: taken froma previous visit  Ht 5\' 4"  (1.626 m)   Wt 141 lb (64 kg)   BMI 24.20 kg/m    Wt Readings from Last 3 Encounters:  06/26/20 141 lb (64 kg)  05/07/20 141 lb (64 kg)  02/24/19 134 lb 6.4 oz (61 kg)       Education/Counseling provided regarding diet and exercise, prevention of chronic diseases, smoking/tobacco cessation, if applicable, and reviewed "Covered Medicare Preventive Services."

## 2020-06-26 NOTE — Patient Instructions (Signed)
Thank you for taking time to come for your Medicare Wellness Visit. I appreciate your ongoing commitment to your health goals. Please review the following plan we discussed and let me know if I can assist you in the future.  Leroy Kennedy LPN  Preventive Care 70 Years and Older, Male Preventive care refers to lifestyle choices and visits with your health care provider that can promote health and wellness. This includes:  A yearly physical exam. This is also called an annual well check.  Regular dental and eye exams.  Immunizations.  Screening for certain conditions.  Healthy lifestyle choices, such as diet and exercise. What can I expect for my preventive care visit? Physical exam Your health care provider will check:  Height and weight. These may be used to calculate body mass index (BMI), which is a measurement that tells if you are at a healthy weight.  Heart rate and blood pressure.  Your skin for abnormal spots. Counseling Your health care provider may ask you questions about:  Alcohol, tobacco, and drug use.  Emotional well-being.  Home and relationship well-being.  Sexual activity.  Eating habits.  History of falls.  Memory and ability to understand (cognition).  Work and work Statistician. What immunizations do I need?  Influenza (flu) vaccine  This is recommended every year. Tetanus, diphtheria, and pertussis (Tdap) vaccine  You may need a Td booster every 10 years. Varicella (chickenpox) vaccine  You may need this vaccine if you have not already been vaccinated. Zoster (shingles) vaccine  You may need this after age 66. Pneumococcal conjugate (PCV13) vaccine  One dose is recommended after age 84. Pneumococcal polysaccharide (PPSV23) vaccine  One dose is recommended after age 88. Measles, mumps, and rubella (MMR) vaccine  You may need at least one dose of MMR if you were born in 1957 or later. You may also need a second dose. Meningococcal  conjugate (MenACWY) vaccine  You may need this if you have certain conditions. Hepatitis A vaccine  You may need this if you have certain conditions or if you travel or work in places where you may be exposed to hepatitis A. Hepatitis B vaccine  You may need this if you have certain conditions or if you travel or work in places where you may be exposed to hepatitis B. Haemophilus influenzae type b (Hib) vaccine  You may need this if you have certain conditions. You may receive vaccines as individual doses or as more than one vaccine together in one shot (combination vaccines). Talk with your health care provider about the risks and benefits of combination vaccines. What tests do I need? Blood tests  Lipid and cholesterol levels. These may be checked every 5 years, or more frequently depending on your overall health.  Hepatitis C test.  Hepatitis B test. Screening  Lung cancer screening. You may have this screening every year starting at age 24 if you have a 30-pack-year history of smoking and currently smoke or have quit within the past 15 years.  Colorectal cancer screening. All adults should have this screening starting at age 52 and continuing until age 61. Your health care provider may recommend screening at age 57 if you are at increased risk. You will have tests every 1-10 years, depending on your results and the type of screening test.  Prostate cancer screening. Recommendations will vary depending on your family history and other risks.  Diabetes screening. This is done by checking your blood sugar (glucose) after you have not eaten for  a while (fasting). You may have this done every 1-3 years.  Abdominal aortic aneurysm (AAA) screening. You may need this if you are a current or former smoker.  Sexually transmitted disease (STD) testing. Follow these instructions at home: Eating and drinking  Eat a diet that includes fresh fruits and vegetables, whole grains, lean  protein, and low-fat dairy products. Limit your intake of foods with high amounts of sugar, saturated fats, and salt.  Take vitamin and mineral supplements as recommended by your health care provider.  Do not drink alcohol if your health care provider tells you not to drink.  If you drink alcohol: ? Limit how much you have to 0-2 drinks a day. ? Be aware of how much alcohol is in your drink. In the [place] one drink equals one 12 oz bottle of beer (355 mL), one 5 oz glass of wine (148 mL), or one 1 oz glass of hard liquor (44 mL). Lifestyle  Take daily care of your teeth and gums.  Stay active. Exercise for at least 30 minutes on 5 or more days each week.  Do not use any products that contain nicotine or tobacco, such as cigarettes, e-cigarettes, and chewing tobacco. If you need help quitting, ask your health care provider.  If you are sexually active, practice safe sex. Use a condom or other form of protection to prevent STIs (sexually transmitted infections).  Talk with your health care provider about taking a low-dose aspirin or statin. What's next?  Visit your health care provider once a year for a well check visit.  Ask your health care provider how often you should have your eyes and teeth checked.  Stay up to date on all vaccines. This information is not intended to replace advice given to you by your health care provider. Make sure you discuss any questions you have with your health care provider. Document Revised: 06/03/2018 Document Reviewed: 06/03/2018 Elsevier Patient Education  2020 Elsevier Inc.  

## 2020-10-03 ENCOUNTER — Other Ambulatory Visit: Payer: Self-pay | Admitting: Family Medicine

## 2020-10-03 DIAGNOSIS — N401 Enlarged prostate with lower urinary tract symptoms: Secondary | ICD-10-CM

## 2020-10-03 DIAGNOSIS — E7800 Pure hypercholesterolemia, unspecified: Secondary | ICD-10-CM

## 2020-10-03 DIAGNOSIS — I10 Essential (primary) hypertension: Secondary | ICD-10-CM

## 2020-11-05 ENCOUNTER — Ambulatory Visit: Payer: Self-pay | Admitting: Family Medicine

## 2020-11-27 ENCOUNTER — Other Ambulatory Visit: Payer: Self-pay

## 2020-11-28 ENCOUNTER — Encounter: Payer: Self-pay | Admitting: Internal Medicine

## 2020-11-28 ENCOUNTER — Ambulatory Visit (INDEPENDENT_AMBULATORY_CARE_PROVIDER_SITE_OTHER): Payer: Medicare HMO | Admitting: Internal Medicine

## 2020-11-28 ENCOUNTER — Other Ambulatory Visit: Payer: Self-pay

## 2020-11-28 VITALS — BP 122/78 | HR 73 | Temp 98.9°F | Ht 65.0 in | Wt 141.6 lb

## 2020-11-28 DIAGNOSIS — Z0001 Encounter for general adult medical examination with abnormal findings: Secondary | ICD-10-CM | POA: Insufficient documentation

## 2020-11-28 DIAGNOSIS — E785 Hyperlipidemia, unspecified: Secondary | ICD-10-CM | POA: Diagnosis not present

## 2020-11-28 DIAGNOSIS — Z23 Encounter for immunization: Secondary | ICD-10-CM

## 2020-11-28 DIAGNOSIS — R351 Nocturia: Secondary | ICD-10-CM

## 2020-11-28 DIAGNOSIS — N401 Enlarged prostate with lower urinary tract symptoms: Secondary | ICD-10-CM

## 2020-11-28 DIAGNOSIS — Z0000 Encounter for general adult medical examination without abnormal findings: Secondary | ICD-10-CM | POA: Diagnosis not present

## 2020-11-28 DIAGNOSIS — I10 Essential (primary) hypertension: Secondary | ICD-10-CM

## 2020-11-28 DIAGNOSIS — R001 Bradycardia, unspecified: Secondary | ICD-10-CM | POA: Insufficient documentation

## 2020-11-28 NOTE — Progress Notes (Signed)
Subjective:  Patient ID: Patrick Abbott, male    DOB: 10/30/1950  Age: 70 y.o. MRN: [id number]  CC: Annual Exam, Hypertension, and Hyperlipidemia  This visit occurred during the SARS-CoV-2 public health emergency.  Safety protocols were in place, including screening questions prior to the visit, additional usage of staff PPE, and extensive cleaning of exam room while observing appropriate contact time as indicated for disinfecting solutions.   HPI Patrick Abbott presents for a CPX, f/up, and to establish.  He has a history of hypertension and bradycardia.  He is active and denies any recent episodes of dizziness, lightheadedness, palpitations, near-syncope, syncope, DOE, edema, or fatigue.  Outpatient Medications Prior to Visit  Medication Sig Dispense Refill   amLODipine (NORVASC) 5 MG tablet TAKE 1 TABLET EVERY DAY 90 tablet 1   Calcium Carb-Cholecalciferol (CALCIUM 1000 + D PO) Take by mouth.     pravastatin (PRAVACHOL) 40 MG tablet TAKE 1 TABLET EVERY DAY 90 tablet 1   tamsulosin (FLOMAX) 0.4 MG CAPS capsule TAKE 1 CAPSULE EVERY DAY 90 capsule 1   VITAMIN D, CHOLECALCIFEROL, PO Take 1 tablet by mouth daily.     aspirin 325 MG tablet Take 325 mg by mouth daily. Reported on 10/04/2015     No facility-administered medications prior to visit.    ROS Review of Systems  Constitutional:  Negative for diaphoresis and fatigue.  HENT: Negative.    Eyes: Negative.   Respiratory:  Negative for cough, chest tightness, shortness of breath and wheezing.   Cardiovascular:  Negative for chest pain, palpitations and leg swelling.  Gastrointestinal:  Negative for abdominal pain, constipation, diarrhea, nausea and vomiting.  Endocrine: Negative.   Genitourinary: Negative.  Negative for difficulty urinating, scrotal swelling and testicular pain.  Musculoskeletal:  Negative for arthralgias and myalgias.  Skin: Negative.  Negative for color change and pallor.  Neurological: Negative.  Negative for  dizziness, weakness and light-headedness.  Hematological:  Negative for adenopathy. Does not bruise/bleed easily.  Psychiatric/Behavioral: Negative.     Objective:  BP 122/78 (BP Location: Left Arm, Patient Position: Sitting, Cuff Size: Large)   Pulse 73   Temp 98.9 F (37.2 C) (Oral)   Ht 5\' 5"  (1.651 m)   Wt 141 lb 9.6 oz (64.2 kg)   SpO2 98%   BMI 23.56 kg/m   BP Readings from Last 3 Encounters:  11/28/20 122/78  06/26/20 130/75  05/07/20 130/75    Wt Readings from Last 3 Encounters:  11/28/20 141 lb 9.6 oz (64.2 kg)  06/26/20 141 lb (64 kg)  05/07/20 141 lb (64 kg)    Physical Exam Vitals reviewed.  HENT:     Nose: Nose normal.     Mouth/Throat:     Mouth: Mucous membranes are moist.  Eyes:     General: No scleral icterus.    Conjunctiva/sclera: Conjunctivae normal.  Cardiovascular:     Rate and Rhythm: Normal rate and regular rhythm.     Heart sounds: Normal heart sounds, S1 normal and S2 normal. No murmur heard.   No gallop.     Comments: EKG- NSR, 61 bpm RBBB - no significant change compared to the prior EKG No Q waves Pulmonary:     Effort: Pulmonary effort is normal. No respiratory distress.     Breath sounds: No stridor. No wheezing, rhonchi or rales.  Abdominal:     General: Abdomen is flat. There is no distension.     Palpations: There is no mass.     Tenderness: There  is no abdominal tenderness. There is no guarding or rebound.     Hernia: No hernia is present. There is no hernia in the left inguinal area or right inguinal area.  Genitourinary:    Pubic Area: No rash.      Penis: Normal and circumcised.      Testes: Normal.     Epididymis:     Right: Normal.     Left: Normal.     Prostate: Enlarged (1+ smooth symm BPH). Not tender and no nodules present.     Rectum: Guaiac result positive. Internal hemorrhoid present. No mass, tenderness, anal fissure or external hemorrhoid. Normal anal tone.  Musculoskeletal:     Cervical back: Neck supple.      Right lower leg: No edema.     Left lower leg: No edema.  Lymphadenopathy:     Cervical: No cervical adenopathy.     Lower Body: No right inguinal adenopathy. No left inguinal adenopathy.  Skin:    General: Skin is warm and dry.  Neurological:     General: No focal deficit present.     Mental Status: He is alert.  Psychiatric:        Mood and Affect: Mood normal.        Behavior: Behavior normal.    Lab Results  Component Value Date   WBC 5.6 11/28/2020   HGB 13.2 11/28/2020   HCT 39.7 11/28/2020   PLT 252.0 11/28/2020   GLUCOSE 95 11/28/2020   CHOL 150 11/28/2020   TRIG 199.0 (H) 11/28/2020   HDL 35.40 (L) 11/28/2020   LDLCALC 75 11/28/2020   ALT 29 11/28/2020   AST 21 11/28/2020   NA 137 11/28/2020   K 4.4 11/28/2020   CL 103 11/28/2020   CREATININE 1.09 11/28/2020   BUN 14 11/28/2020   CO2 28 11/28/2020   TSH 0.86 11/28/2020   PSA 1.32 11/28/2020   HGBA1C 5.1 12/23/2012    Patient was never admitted.  Assessment & Plan:   Patrick Abbott was seen today for annual exam, hypertension and hyperlipidemia.  Diagnoses and all orders for this visit:  Encounter for general adult medical examination with abnormal findings- Exam completed, labs reviewed, vaccines reviewed and updated, cancer screenings are up-to-date, patient education was given.  Chronic sinus bradycardia- His heart rate is 60 on the EKG today.  Labs are negative for secondary causes.  He is tolerating this well.  No intervention needed. -     Thyroid Panel With TSH; Future -     Thyroid Panel With TSH  Essential hypertension- His blood pressure is adequately well controlled. -     CBC with Differential/Platelet; Future -     Basic metabolic panel; Future -     Thyroid Panel With TSH; Future -     Hepatic function panel; Future -     EKG 12-Lead -     Hepatic function panel -     Thyroid Panel With TSH -     Basic metabolic panel -     CBC with Differential/Platelet  Benign prostatic hyperplasia  with nocturia- His PSA is low which is a reassuring sign that he does not have prostate cancer.  He has no symptoms that need to be treated. -     PSA; Future -     Urinalysis, Routine w reflex microscopic; Future -     Urinalysis, Routine w reflex microscopic -     PSA  Dyslipidemia, goal LDL below 130- LDL goal achieved.  Doing well on the statin  -     Lipid panel; Future -     Thyroid Panel With TSH; Future -     Hepatic function panel; Future -     Hepatic function panel -     Thyroid Panel With TSH -     Lipid panel  Need for shingles vaccine -     Zoster Vaccine Adjuvanted [facility]) injection; Inject 0.5 mLs into the muscle once for 1 dose.  I have discontinued Patrick Abbott's aspirin. I am also having him start on Shingrix. Additionally, I am having him maintain his (VITAMIN D, CHOLECALCIFEROL, PO), Calcium Carb-Cholecalciferol (CALCIUM 1000 + D PO), amLODipine, pravastatin, and tamsulosin.  Meds ordered this encounter  Medications   Zoster Vaccine Adjuvanted [facility]) injection    Sig: Inject 0.5 mLs into the muscle once for 1 dose.    Dispense:  0.5 mL    Refill:  1      Follow-up: Return in about 6 months (around 05/30/2021).  Scarlette Calico, MD

## 2020-11-28 NOTE — Patient Instructions (Signed)

## 2020-11-29 LAB — LIPID PANEL
Cholesterol: 150 mg/dL (ref 0–200)
HDL: 35.4 mg/dL — ABNORMAL LOW (ref >39.00)
LDL Cholesterol: 75 mg/dL (ref 0–99)
NonHDL: 115.08
Total CHOL/HDL Ratio: 4
Triglycerides: 199 mg/dL — ABNORMAL HIGH (ref 0.0–149.0)
VLDL: 39.8 mg/dL (ref 0.0–40.0)

## 2020-11-29 LAB — CBC WITH DIFFERENTIAL/PLATELET
Basophils Absolute: 0.1 10*3/uL (ref 0.0–0.1)
Basophils Relative: 1.8 % (ref 0.0–3.0)
Eosinophils Absolute: 0.1 10*3/uL (ref 0.0–0.7)
Eosinophils Relative: 1.5 % (ref 0.0–5.0)
HCT: 39.7 % (ref 39.0–52.0)
Hemoglobin: 13.2 g/dL (ref 13.0–17.0)
Lymphocytes Relative: 37.5 % (ref 12.0–46.0)
Lymphs Abs: 2.1 10*3/uL (ref 0.7–4.0)
MCHC: 33.1 g/dL (ref 30.0–36.0)
MCV: 80.4 fl (ref 78.0–100.0)
Monocytes Absolute: 0.3 10*3/uL (ref 0.1–1.0)
Monocytes Relative: 6 % (ref 3.0–12.0)
Neutro Abs: 3 10*3/uL (ref 1.4–7.7)
Neutrophils Relative %: 53.2 % (ref 43.0–77.0)
Platelets: 252 10*3/uL (ref 150.0–400.0)
RBC: 4.94 Mil/uL (ref 4.22–5.81)
RDW: 13.5 % (ref 11.5–15.5)
WBC: 5.6 10*3/uL (ref 4.0–10.5)

## 2020-11-29 LAB — HEPATIC FUNCTION PANEL
ALT: 29 U/L (ref 0–53)
AST: 21 U/L (ref 0–37)
Albumin: 4.2 g/dL (ref 3.5–5.2)
Alkaline Phosphatase: 87 U/L (ref 39–117)
Bilirubin, Direct: 0.1 mg/dL (ref 0.0–0.3)
Total Bilirubin: 0.4 mg/dL (ref 0.2–1.2)
Total Protein: 7.7 g/dL (ref 6.0–8.3)

## 2020-11-29 LAB — PSA: PSA: 1.32 ng/mL (ref 0.10–4.00)

## 2020-11-29 LAB — URINALYSIS, ROUTINE W REFLEX MICROSCOPIC
Bilirubin Urine: NEGATIVE
Ketones, ur: NEGATIVE
Leukocytes,Ua: NEGATIVE
Nitrite: NEGATIVE
RBC / HPF: NONE SEEN (ref 0–?)
Specific Gravity, Urine: <=1.005 — AB (ref 1.000–1.030)
Total Protein, Urine: NEGATIVE
Urine Glucose: NEGATIVE
Urobilinogen, UA: 0.2 (ref 0.0–1.0)
WBC, UA: NONE SEEN (ref 0–?)
pH: 6 (ref 5.0–8.0)

## 2020-11-29 LAB — BASIC METABOLIC PANEL
BUN: 14 mg/dL (ref 6–23)
CO2: 28 mEq/L (ref 19–32)
Calcium: 8.7 mg/dL (ref 8.4–10.5)
Chloride: 103 mEq/L (ref 96–112)
Creatinine, Ser: 1.09 mg/dL (ref 0.40–1.50)
GFR: 68.8 mL/min (ref >60.00)
Glucose, Bld: 95 mg/dL (ref 70–99)
Potassium: 4.4 mEq/L (ref 3.5–5.1)
Sodium: 137 mEq/L (ref 135–145)

## 2020-11-29 LAB — THYROID PANEL WITH TSH
Free Thyroxine Index: 2.4 (ref 1.4–3.8)
T3 Uptake: 33 % (ref 22–35)
T4, Total: 7.3 ug/dL (ref 4.9–10.5)
TSH: 0.86 mIU/L (ref 0.40–4.50)

## 2020-11-30 DIAGNOSIS — Z23 Encounter for immunization: Secondary | ICD-10-CM | POA: Insufficient documentation

## 2020-11-30 MED ORDER — SHINGRIX 50 MCG/0.5ML IM SUSR: 0.5000 mL | Freq: Once | INTRAMUSCULAR | 1 refills | Status: AC

## 2021-01-12 DIAGNOSIS — H5203 Hypermetropia, bilateral: Secondary | ICD-10-CM | POA: Diagnosis not present

## 2021-03-16 ENCOUNTER — Other Ambulatory Visit: Payer: Self-pay | Admitting: Family Medicine

## 2021-03-16 DIAGNOSIS — R351 Nocturia: Secondary | ICD-10-CM

## 2021-03-16 DIAGNOSIS — N401 Enlarged prostate with lower urinary tract symptoms: Secondary | ICD-10-CM

## 2021-03-16 DIAGNOSIS — E7800 Pure hypercholesterolemia, unspecified: Secondary | ICD-10-CM

## 2021-03-16 DIAGNOSIS — I10 Essential (primary) hypertension: Secondary | ICD-10-CM

## 2021-06-03 ENCOUNTER — Other Ambulatory Visit: Payer: Self-pay

## 2021-06-03 ENCOUNTER — Encounter: Payer: Self-pay | Admitting: Internal Medicine

## 2021-06-03 ENCOUNTER — Ambulatory Visit (INDEPENDENT_AMBULATORY_CARE_PROVIDER_SITE_OTHER): Payer: Medicare HMO | Admitting: Internal Medicine

## 2021-06-03 VITALS — BP 132/86 | HR 69 | Temp 98.0°F | Ht 65.0 in | Wt 146.0 lb

## 2021-06-03 DIAGNOSIS — R300 Dysuria: Secondary | ICD-10-CM | POA: Insufficient documentation

## 2021-06-03 DIAGNOSIS — N410 Acute prostatitis: Secondary | ICD-10-CM | POA: Diagnosis not present

## 2021-06-03 DIAGNOSIS — Z23 Encounter for immunization: Secondary | ICD-10-CM

## 2021-06-03 LAB — URINALYSIS, ROUTINE W REFLEX MICROSCOPIC
Bilirubin Urine: NEGATIVE
Ketones, ur: NEGATIVE
Leukocytes,Ua: NEGATIVE
Nitrite: NEGATIVE
RBC / HPF: NONE SEEN (ref 0–?)
Specific Gravity, Urine: 1.015 (ref 1.000–1.030)
Total Protein, Urine: NEGATIVE
Urine Glucose: NEGATIVE
Urobilinogen, UA: 0.2 (ref 0.0–1.0)
WBC, UA: NONE SEEN (ref 0–?)
pH: 6.5 (ref 5.0–8.0)

## 2021-06-03 MED ORDER — SHINGRIX 50 MCG/0.5ML IM SUSR: 0.5000 mL | Freq: Once | INTRAMUSCULAR | 1 refills | Status: AC

## 2021-06-03 MED ORDER — SULFAMETHOXAZOLE-TRIMETHOPRIM 800-160 MG PO TABS: 1.0000 | ORAL_TABLET | Freq: Two times a day (BID) | ORAL | 0 refills | Status: AC

## 2021-06-03 NOTE — Patient Instructions (Signed)

## 2021-06-03 NOTE — Progress Notes (Signed)
Subjective:  Patient ID: Patrick Abbott, male    DOB: 1950-12-27  Age: 70 y.o. MRN: [id number]  CC: Urinary Tract Infection  This visit occurred during the SARS-CoV-2 public health emergency.  Safety protocols were in place, including screening questions prior to the visit, additional usage of staff PPE, and extensive cleaning of exam room while observing appropriate contact time as indicated for disinfecting solutions.   HPI Patrick Abbott presents for f/up --  He complains of a one week hx of dysuria, nocturia, pelvic pain, and chills.  Outpatient Medications Prior to Visit  Medication Sig Dispense Refill   amLODipine (NORVASC) 5 MG tablet TAKE 1 TABLET EVERY DAY 90 tablet 1   Calcium Carb-Cholecalciferol (CALCIUM 1000 + D PO) Take by mouth.     pravastatin (PRAVACHOL) 40 MG tablet TAKE 1 TABLET EVERY DAY 90 tablet 1   tamsulosin (FLOMAX) 0.4 MG CAPS capsule TAKE 1 CAPSULE EVERY DAY 90 capsule 1   VITAMIN D, CHOLECALCIFEROL, PO Take 1 tablet by mouth daily.     No facility-administered medications prior to visit.    ROS Review of Systems  Constitutional:  Positive for chills. Negative for fatigue and fever.  HENT: Negative.    Eyes: Negative.   Respiratory:  Negative for cough, chest tightness, shortness of breath and wheezing.   Cardiovascular:  Negative for chest pain, palpitations and leg swelling.  Gastrointestinal:  Negative for abdominal pain, diarrhea and nausea.  Endocrine: Negative.   Genitourinary:  Positive for dysuria. Negative for difficulty urinating, flank pain, frequency, hematuria, scrotal swelling, testicular pain and urgency.  Musculoskeletal: Negative.   Skin: Negative.   Neurological:  Negative for dizziness, weakness and light-headedness.  Hematological:  Negative for adenopathy. Does not bruise/bleed easily.  Psychiatric/Behavioral: Negative.     Objective:  BP 132/86 (BP Location: Right Arm, Patient Position: Sitting, Cuff Size: Normal)   Pulse 69   Temp 98  F (36.7 C) (Oral)   Ht 5\' 5"  (1.651 m)   Wt 146 lb (66.2 kg)   SpO2 95%   BMI 24.30 kg/m   BP Readings from Last 3 Encounters:  06/03/21 132/86  11/28/20 122/78  06/26/20 130/75    Wt Readings from Last 3 Encounters:  06/03/21 146 lb (66.2 kg)  11/28/20 141 lb 9.6 oz (64.2 kg)  06/26/20 141 lb (64 kg)    Physical Exam Vitals reviewed.  Constitutional:      Appearance: He is not ill-appearing.  HENT:     Nose: Nose normal.     Mouth/Throat:     Mouth: Mucous membranes are moist.  Eyes:     General: No scleral icterus.    Conjunctiva/sclera: Conjunctivae normal.  Cardiovascular:     Rate and Rhythm: Normal rate and regular rhythm.     Heart sounds: No murmur heard. Pulmonary:     Effort: Pulmonary effort is normal.     Breath sounds: No stridor. No wheezing, rhonchi or rales.  Abdominal:     General: Abdomen is flat.     Palpations: There is no mass.     Tenderness: There is no abdominal tenderness. There is no guarding.     Hernia: No hernia is present. There is no hernia in the left inguinal area or right inguinal area.  Genitourinary:    Pubic Area: No rash.      Penis: Normal and circumcised. No swelling.      Testes: Normal.     Epididymis:     Right: Normal. No mass.  Left: Normal. No mass.     Prostate: Enlarged and tender. No nodules present.     Rectum: Normal. Guaiac result negative. No mass, tenderness, anal fissure, external hemorrhoid or internal hemorrhoid. Normal anal tone.     Comments: Right lobe is boggy and tender Musculoskeletal:        General: Normal range of motion.     Cervical back: Neck supple.     Right lower leg: No edema.     Left lower leg: No edema.  Lymphadenopathy:     Cervical: No cervical adenopathy.     Lower Body: No right inguinal adenopathy. No left inguinal adenopathy.  Skin:    General: Skin is warm and dry.  Neurological:     General: No focal deficit present.     Mental Status: He is alert.    Lab Results   Component Value Date   WBC 5.6 11/28/2020   HGB 13.2 11/28/2020   HCT 39.7 11/28/2020   PLT 252.0 11/28/2020   GLUCOSE 95 11/28/2020   CHOL 150 11/28/2020   TRIG 199.0 (H) 11/28/2020   HDL 35.40 (L) 11/28/2020   LDLCALC 75 11/28/2020   ALT 29 11/28/2020   AST 21 11/28/2020   NA 137 11/28/2020   K 4.4 11/28/2020   CL 103 11/28/2020   CREATININE 1.09 11/28/2020   BUN 14 11/28/2020   CO2 28 11/28/2020   TSH 0.86 11/28/2020   PSA 1.32 11/28/2020   HGBA1C 5.1 12/23/2012    Patient was never admitted.  Assessment & Plan:   Patrick Abbott was seen today for urinary tract infection.  Diagnoses and all orders for this visit:  Need for shingles vaccine -     Zoster Vaccine Adjuvanted [facility]) injection; Inject 0.5 mLs into the muscle once for 1 dose.  Dysuria- UA and urine clx are negative. Will treat for acute bacterial prostatitis. -     Urinalysis, Routine w reflex microscopic; Future -     CULTURE, URINE COMPREHENSIVE; Future -     CULTURE, URINE COMPREHENSIVE -     Urinalysis, Routine w reflex microscopic  Prostatitis, acute -     sulfamethoxazole-trimethoprim (BACTRIM DS) 800-160 MG tablet; Take 1 tablet by mouth 2 (two) times daily.  I am having Patrick Abbott start on Shingrix and sulfamethoxazole-trimethoprim. I am also having him maintain his (VITAMIN D, CHOLECALCIFEROL, PO), Calcium Carb-Cholecalciferol (CALCIUM 1000 + D PO), amLODipine, pravastatin, and tamsulosin.  Meds ordered this encounter  Medications   Zoster Vaccine Adjuvanted [facility]) injection    Sig: Inject 0.5 mLs into the muscle once for 1 dose.    Dispense:  0.5 mL    Refill:  1   sulfamethoxazole-trimethoprim (BACTRIM DS) 800-160 MG tablet    Sig: Take 1 tablet by mouth 2 (two) times daily.    Dispense:  60 tablet    Refill:  0     Follow-up: Return in about 3 months (around 09/01/2021).  Scarlette Calico, MD

## 2021-06-05 ENCOUNTER — Telehealth: Payer: Self-pay | Admitting: Internal Medicine

## 2021-06-05 LAB — CULTURE, URINE COMPREHENSIVE: RESULT:: NO GROWTH

## 2021-06-05 NOTE — Telephone Encounter (Signed)
1.Medication Requested: amLODipine (NORVASC) 5 MG tablet pravastatin (PRAVACHOL) 40 MG tablet tamsulosin (FLOMAX) 0.4 MG CAPS capsule  2. Pharmacy (Name, [place], [place]): [id number] [place], [place] Phone:  [phone]  Fax:  [phone]     3. On Med List: y  67. Last Visit with PCP:  5. Next visit date with PCP:  Pt states he is out of tamsulosin, requesting refill.   Agent: Please be advised that RX refills may take up to 3 business days. We ask that you follow-up with your pharmacy.

## 2021-06-06 ENCOUNTER — Other Ambulatory Visit: Payer: Self-pay | Admitting: Internal Medicine

## 2021-06-06 DIAGNOSIS — N401 Enlarged prostate with lower urinary tract symptoms: Secondary | ICD-10-CM

## 2021-06-06 DIAGNOSIS — E7800 Pure hypercholesterolemia, unspecified: Secondary | ICD-10-CM

## 2021-06-06 DIAGNOSIS — I10 Essential (primary) hypertension: Secondary | ICD-10-CM

## 2021-06-06 MED ORDER — AMLODIPINE BESYLATE 5 MG PO TABS: ORAL_TABLET | ORAL | 1 refills | Status: DC

## 2021-06-06 MED ORDER — TAMSULOSIN HCL 0.4 MG PO CAPS
0.4000 mg | ORAL_CAPSULE | Freq: Every day | ORAL | 1 refills | Status: DC
Start: 2021-06-06 — End: 2021-06-06

## 2021-06-06 MED ORDER — PRAVASTATIN SODIUM 40 MG PO TABS: ORAL_TABLET | ORAL | 1 refills | Status: DC

## 2021-06-06 MED ORDER — PRAVASTATIN SODIUM 40 MG PO TABS
ORAL_TABLET | ORAL | 1 refills | Status: DC
Start: 2021-06-06 — End: 2021-09-06

## 2021-06-06 MED ORDER — TAMSULOSIN HCL 0.4 MG PO CAPS
0.4000 mg | ORAL_CAPSULE | Freq: Every day | ORAL | 1 refills | Status: DC
Start: 2021-06-06 — End: 2022-03-04

## 2021-06-06 NOTE — Telephone Encounter (Signed)
Patient requested the [facility] [address] instead.   Are you able to resend those to the pharmacy listed below?   [id number] [place], [place] Phone:  [phone]  Fax:  [phone]

## 2021-07-08 ENCOUNTER — Telehealth: Payer: Self-pay | Admitting: Internal Medicine

## 2021-07-08 NOTE — Telephone Encounter (Signed)
Left message for patient to call back to schedule Medicare Annual Wellness Visit   Last AWV  06/26/20  Please schedule at anytime with [facility] if patient calls the office back.    40 Minutes appointment   Any questions, please call me at [phone]

## 2021-08-16 DIAGNOSIS — H35371 Puckering of macula, right eye: Secondary | ICD-10-CM | POA: Diagnosis not present

## 2021-08-16 DIAGNOSIS — H2513 Age-related nuclear cataract, bilateral: Secondary | ICD-10-CM | POA: Diagnosis not present

## 2021-09-04 ENCOUNTER — Encounter: Payer: Self-pay | Admitting: Internal Medicine

## 2021-09-04 ENCOUNTER — Ambulatory Visit (INDEPENDENT_AMBULATORY_CARE_PROVIDER_SITE_OTHER): Payer: No Typology Code available for payment source | Admitting: Internal Medicine

## 2021-09-04 ENCOUNTER — Other Ambulatory Visit: Payer: Self-pay

## 2021-09-04 DIAGNOSIS — E7800 Pure hypercholesterolemia, unspecified: Secondary | ICD-10-CM

## 2021-09-04 DIAGNOSIS — I10 Essential (primary) hypertension: Secondary | ICD-10-CM | POA: Diagnosis not present

## 2021-09-04 NOTE — Patient Instructions (Signed)

## 2021-09-04 NOTE — Progress Notes (Signed)
? ?Subjective:  ?Patient ID: Patrick Abbott, male    DOB: 06-03-1951  Age: 71 y.o. MRN: [id number] ? ?CC: Hypertension ? ?This visit occurred during the SARS-CoV-2 public health emergency.  Safety protocols were in place, including screening questions prior to the visit, additional usage of staff PPE, and extensive cleaning of exam room while observing appropriate contact time as indicated for disinfecting solutions.   ? ?HPI ?Patrick Abbott presents for f/up -  ? ?He tells me that his blood pressure has been well controlled.  He is active and denies chest pain, shortness of breath, diaphoresis, dizziness, lightheadedness, or edema. ? ?Outpatient Medications Prior to Visit  ?Medication Sig Dispense Refill  ? Calcium Carb-Cholecalciferol (CALCIUM 1000 + D PO) Take by mouth.    ? tamsulosin (FLOMAX) 0.4 MG CAPS capsule Take 1 capsule (0.4 mg total) by mouth daily. 90 capsule 1  ? VITAMIN D, CHOLECALCIFEROL, PO Take 1 tablet by mouth daily.    ? amLODipine (NORVASC) 5 MG tablet TAKE 1 TABLET EVERY DAY 90 tablet 1  ? pravastatin (PRAVACHOL) 40 MG tablet TAKE 1 TABLET EVERY DAY 90 tablet 1  ? ?No facility-administered medications prior to visit.  ? ? ?ROS ?Review of Systems  ?Constitutional:  Negative for diaphoresis and fatigue.  ?HENT: Negative.    ?Eyes: Negative.   ?Respiratory:  Negative for cough, chest tightness, shortness of breath and wheezing.   ?Cardiovascular:  Negative for chest pain, palpitations and leg swelling.  ?Gastrointestinal:  Negative for abdominal pain, diarrhea, nausea and vomiting.  ?Endocrine: Negative.   ?Genitourinary: Negative.  Negative for difficulty urinating.  ?Musculoskeletal: Negative.  Negative for myalgias.  ?Skin: Negative.   ?Neurological:  Negative for dizziness, weakness and light-headedness.  ?Hematological:  Negative for adenopathy. Does not bruise/bleed easily.  ?Psychiatric/Behavioral: Negative.    ? ?Objective:  ?BP 124/82 (BP Location: Left Arm, Patient Position: Sitting, Cuff Size:  Large)   Pulse 89   Temp 97.8 ?F (36.6 ?C) (Oral)   Resp 16   Ht '5\' 5"'$  (1.651 m)   Wt 143 lb (64.9 kg)   SpO2 96%   BMI 23.80 kg/m?  ? ?BP Readings from Last 3 Encounters:  ?09/04/21 124/82  ?06/03/21 132/86  ?11/28/20 122/78  ? ? ?Wt Readings from Last 3 Encounters:  ?09/04/21 143 lb (64.9 kg)  ?06/03/21 146 lb (66.2 kg)  ?11/28/20 141 lb 9.6 oz (64.2 kg)  ? ? ?Physical Exam ?Vitals reviewed.  ?HENT:  ?   Nose: Nose normal.  ?   Mouth/Throat:  ?   Mouth: Mucous membranes are moist.  ?Eyes:  ?   General: No scleral icterus. ?   Conjunctiva/sclera: Conjunctivae normal.  ?Cardiovascular:  ?   Rate and Rhythm: Normal rate and regular rhythm.  ?   Heart sounds: No murmur heard. ?Pulmonary:  ?   Effort: Pulmonary effort is normal.  ?   Breath sounds: No stridor. No wheezing, rhonchi or rales.  ?Abdominal:  ?   General: Abdomen is flat.  ?   Palpations: There is no mass.  ?   Tenderness: There is no abdominal tenderness. There is no guarding.  ?Musculoskeletal:     ?   General: Normal range of motion.  ?   Cervical back: Neck supple.  ?   Right lower leg: No edema.  ?   Left lower leg: No edema.  ?Lymphadenopathy:  ?   Cervical: No cervical adenopathy.  ?Skin: ?   General: Skin is warm.  ?Neurological:  ?  General: No focal deficit present.  ?   Mental Status: He is alert.  ? ? ?Lab Results  ?Component Value Date  ? WBC 5.6 11/28/2020  ? HGB 13.2 11/28/2020  ? HCT 39.7 11/28/2020  ? PLT 252.0 11/28/2020  ? GLUCOSE 95 11/28/2020  ? CHOL 150 11/28/2020  ? TRIG 199.0 (H) 11/28/2020  ? HDL 35.40 (L) 11/28/2020  ? [place] 75 11/28/2020  ? ALT 29 11/28/2020  ? AST 21 11/28/2020  ? NA 137 11/28/2020  ? K 4.4 11/28/2020  ? CL 103 11/28/2020  ? CREATININE 1.09 11/28/2020  ? BUN 14 11/28/2020  ? CO2 28 11/28/2020  ? TSH 0.86 11/28/2020  ? PSA 1.32 11/28/2020  ? HGBA1C 5.1 12/23/2012  ? ? ?Patient was never admitted. ? ?Assessment & Plan:  ? ?Patrick Abbott was seen today for hypertension. ? ?Diagnoses and all orders for this  visit: ? ?Essential hypertension- His blood pressure is well controlled. ?-     amLODipine (NORVASC) 5 MG tablet; TAKE 1 TABLET EVERY DAY ? ?Pure hypercholesterolemia- LDL goal achieved. Doing well on the statin  ?-     pravastatin (PRAVACHOL) 40 MG tablet; TAKE 1 TABLET EVERY DAY ? ? ?I am having Patrick Abbott maintain his (VITAMIN D, CHOLECALCIFEROL, PO), Calcium Carb-Cholecalciferol (CALCIUM 1000 + D PO), tamsulosin, amLODipine, and pravastatin. ? ?Meds ordered this encounter  ?Medications  ? amLODipine (NORVASC) 5 MG tablet  ?  Sig: TAKE 1 TABLET EVERY DAY  ?  Dispense:  90 tablet  ?  Refill:  1  ? pravastatin (PRAVACHOL) 40 MG tablet  ?  Sig: TAKE 1 TABLET EVERY DAY  ?  Dispense:  90 tablet  ?  Refill:  1  ? ? ? ?Follow-up: Return in about 6 months (around 03/07/2022). ? ?Scarlette Calico, MD ?

## 2021-09-06 MED ORDER — AMLODIPINE BESYLATE 5 MG PO TABS: ORAL_TABLET | ORAL | 1 refills | Status: DC

## 2021-09-06 MED ORDER — PRAVASTATIN SODIUM 40 MG PO TABS: ORAL_TABLET | ORAL | 1 refills | Status: DC

## 2021-10-16 ENCOUNTER — Telehealth: Payer: Self-pay | Admitting: Internal Medicine

## 2021-10-16 NOTE — Telephone Encounter (Signed)
Left message for patient to call back to schedule Medicare Annual Wellness Visit  ? ?Last AWV  06/26/20 ? ?Please schedule at anytime with [facility] if patient calls the office back.   ? ? ? ?Any questions, please call me at [phone]  ?

## 2021-12-05 ENCOUNTER — Encounter: Payer: Self-pay | Admitting: Internal Medicine

## 2021-12-05 ENCOUNTER — Ambulatory Visit (INDEPENDENT_AMBULATORY_CARE_PROVIDER_SITE_OTHER): Payer: No Typology Code available for payment source

## 2021-12-05 ENCOUNTER — Ambulatory Visit (INDEPENDENT_AMBULATORY_CARE_PROVIDER_SITE_OTHER): Payer: No Typology Code available for payment source | Admitting: Internal Medicine

## 2021-12-05 VITALS — BP 122/76 | HR 65 | Temp 97.8°F | Ht 65.0 in | Wt 139.0 lb

## 2021-12-05 DIAGNOSIS — E785 Hyperlipidemia, unspecified: Secondary | ICD-10-CM | POA: Diagnosis not present

## 2021-12-05 DIAGNOSIS — N401 Enlarged prostate with lower urinary tract symptoms: Secondary | ICD-10-CM | POA: Diagnosis not present

## 2021-12-05 DIAGNOSIS — I10 Essential (primary) hypertension: Secondary | ICD-10-CM | POA: Diagnosis not present

## 2021-12-05 DIAGNOSIS — R351 Nocturia: Secondary | ICD-10-CM

## 2021-12-05 DIAGNOSIS — Z0001 Encounter for general adult medical examination with abnormal findings: Secondary | ICD-10-CM | POA: Diagnosis not present

## 2021-12-05 DIAGNOSIS — M545 Low back pain, unspecified: Secondary | ICD-10-CM

## 2021-12-05 DIAGNOSIS — Z0000 Encounter for general adult medical examination without abnormal findings: Secondary | ICD-10-CM

## 2021-12-05 LAB — TSH: TSH: 0.84 u[IU]/mL (ref 0.35–5.50)

## 2021-12-05 LAB — CBC WITH DIFFERENTIAL/PLATELET
Basophils Absolute: 0 10*3/uL (ref 0.0–0.1)
Basophils Relative: 0.4 % (ref 0.0–3.0)
Eosinophils Absolute: 0.1 10*3/uL (ref 0.0–0.7)
Eosinophils Relative: 1.5 % (ref 0.0–5.0)
HCT: 41.1 % (ref 39.0–52.0)
Hemoglobin: 13.4 g/dL (ref 13.0–17.0)
Lymphocytes Relative: 37.5 % (ref 12.0–46.0)
Lymphs Abs: 2.4 10*3/uL (ref 0.7–4.0)
MCHC: 32.7 g/dL (ref 30.0–36.0)
MCV: 81.5 fl (ref 78.0–100.0)
Monocytes Absolute: 0.3 10*3/uL (ref 0.1–1.0)
Monocytes Relative: 5.4 % (ref 3.0–12.0)
Neutro Abs: 3.5 10*3/uL (ref 1.4–7.7)
Neutrophils Relative %: 55.2 % (ref 43.0–77.0)
Platelets: 228 10*3/uL (ref 150.0–400.0)
RBC: 5.05 Mil/uL (ref 4.22–5.81)
RDW: 13.9 % (ref 11.5–15.5)
WBC: 6.4 10*3/uL (ref 4.0–10.5)

## 2021-12-05 LAB — URINALYSIS, ROUTINE W REFLEX MICROSCOPIC
Bilirubin Urine: NEGATIVE
Hgb urine dipstick: NEGATIVE
Ketones, ur: NEGATIVE
Leukocytes,Ua: NEGATIVE
Nitrite: NEGATIVE
RBC / HPF: NONE SEEN (ref 0–?)
Specific Gravity, Urine: 1.015 (ref 1.000–1.030)
Total Protein, Urine: NEGATIVE
Urine Glucose: NEGATIVE
Urobilinogen, UA: 0.2 (ref 0.0–1.0)
pH: 7 (ref 5.0–8.0)

## 2021-12-05 LAB — BASIC METABOLIC PANEL
BUN: 13 mg/dL (ref 6–23)
CO2: 31 mEq/L (ref 19–32)
Calcium: 9.5 mg/dL (ref 8.4–10.5)
Chloride: 101 mEq/L (ref 96–112)
Creatinine, Ser: 1.02 mg/dL (ref 0.40–1.50)
GFR: 73.97 mL/min (ref >60.00)
Glucose, Bld: 103 mg/dL — ABNORMAL HIGH (ref 70–99)
Potassium: 5.1 mEq/L (ref 3.5–5.1)
Sodium: 138 mEq/L (ref 135–145)

## 2021-12-05 LAB — HEPATIC FUNCTION PANEL
ALT: 29 U/L (ref 0–53)
AST: 22 U/L (ref 0–37)
Albumin: 4.3 g/dL (ref 3.5–5.2)
Alkaline Phosphatase: 53 U/L (ref 39–117)
Bilirubin, Direct: 0.1 mg/dL (ref 0.0–0.3)
Total Bilirubin: 0.5 mg/dL (ref 0.2–1.2)
Total Protein: 7.3 g/dL (ref 6.0–8.3)

## 2021-12-05 LAB — LIPID PANEL
Cholesterol: 193 mg/dL (ref 0–200)
HDL: 40.8 mg/dL (ref >39.00)
NonHDL: 152.01
Total CHOL/HDL Ratio: 5
Triglycerides: 230 mg/dL — ABNORMAL HIGH (ref 0.0–149.0)
VLDL: 46 mg/dL — ABNORMAL HIGH (ref 0.0–40.0)

## 2021-12-05 LAB — LDL CHOLESTEROL, DIRECT: Direct LDL: 102 mg/dL

## 2021-12-05 LAB — PSA: PSA: 1.74 ng/mL (ref 0.10–4.00)

## 2021-12-05 NOTE — Progress Notes (Signed)
Subjective:   Patrick Abbott is a 71 y.o. male who presents for Medicare Annual/Subsequent preventive examination.  Review of Systems    No ROS. Medicare Wellness Visit. Additional risk factors are reflected in social history.       Objective:    Today's Vitals   12/05/21 0930  BP: 122/76  Pulse: 65  Temp: 97.8 F (36.6 C)  TempSrc: Temporal  SpO2: 99%  Weight: 139 lb (63 kg)  Height: '5\' 5"'$  (1.651 m)   Body mass index is 23.13 kg/m.     06/26/2020    9:40 AM  Advanced Directives  Does Patient Have a Medical Advance Directive? No  Would patient like information on creating a medical advance directive? No - Patient declined    Current Medications (verified) Outpatient Encounter Medications as of 12/05/2021  Medication Sig   amLODipine (NORVASC) 5 MG tablet TAKE 1 TABLET EVERY DAY   Calcium Carb-Cholecalciferol (CALCIUM 1000 + D PO) Take by mouth.   pravastatin (PRAVACHOL) 40 MG tablet TAKE 1 TABLET EVERY DAY   tamsulosin (FLOMAX) 0.4 MG CAPS capsule Take 1 capsule (0.4 mg total) by mouth daily.   VITAMIN D, CHOLECALCIFEROL, PO Take 1 tablet by mouth daily.   No facility-administered encounter medications on file as of 12/05/2021.    Allergies (verified) Patient has no known allergies.   History: Past Medical History:  Diagnosis Date   Depression    Hyperlipidemia    Hypertension    Past Surgical History:  Procedure Laterality Date   HERNIA REPAIR     Left   Family History  Problem Relation Age of Onset   Colon cancer Neg Hx    Esophageal cancer Neg Hx    Rectal cancer Neg Hx    Stomach cancer Neg Hx    Social History   Socioeconomic History   Marital status: Married    Spouse name: Not on file   Number of children: Not on file   Years of education: Not on file   Highest education level: Not on file  Occupational History   Not on file  Tobacco Use   Smoking status: Never   Smokeless tobacco: Never  Vaping Use   Vaping Use: Never used   Substance and Sexual Activity   Alcohol use: No   Drug use: No   Sexual activity: Yes  Other Topics Concern   Not on file  Social History Narrative   Marital status: married x 32 years; moved from [place] in 1982.      Children: 3 children; 1 grandchild.      Employment:  Water engineer.      Tobacco: none      Alcohol:  Once per month      Exercise:  Weights, walking.   Social Determinants of Health   Financial Resource Strain: Low Risk  (12/05/2021)   Overall Financial Resource Strain (CARDIA)    Difficulty of Paying Living Expenses: Not hard at all  Food Insecurity: No Food Insecurity (12/05/2021)   Hunger Vital Sign    Worried About Running Out of Food in the Last Year: Never true    Ran Out of Food in the Last Year: Never true  Transportation Needs: No Transportation Needs (12/05/2021)   PRAPARE - Hydrologist (Medical): No    Lack of Transportation (Non-Medical): No  Physical Activity: Sufficiently Active (12/05/2021)   Exercise Vital Sign    Days of Exercise per Week: 7 days  Minutes of Exercise per Session: 30 min  Stress: No Stress Concern Present (12/05/2021)   [place]    Feeling of Stress : Not at all  Social Connections: Moderately Integrated (12/05/2021)   Social Connection and Isolation Panel [NHANES]    Frequency of Communication with Friends and Family: More than three times a week    Frequency of Social Gatherings with Friends and Family: Once a week    Attends Religious Services: More than 4 times per year    Active Member of [employer] or Organizations: No    Attends Music therapist: Never    Marital Status: Married    Tobacco Counseling Counseling given: Not Answered   Clinical Intake:  Pre-visit preparation completed: Yes  Pain : No/denies pain     Nutritional Status: BMI of 19-24  Normal Nutritional Risks: None Diabetes: No  How  often do you need to have someone help you when you read instructions, pamphlets, or other written materials from your doctor or pharmacy?: 1 - Never What is the last grade level you completed in school?: 2 years of High School  Diabetic? No  Interpreter Needed?: No  Information entered by :: Jillene Bucks, CMA   Activities of Daily Living    12/05/2021   10:10 AM 12/04/2021   11:27 AM  In your present state of health, do you have any difficulty performing the following activities:  Hearing? 0 0  Vision? 0 0  Difficulty concentrating or making decisions? 0 0  Walking or climbing stairs? 0 0  Dressing or bathing? 0 0  Doing errands, shopping? 0 0  Preparing Food and eating ?  N  Using the Toilet?  N  In the past six months, have you accidently leaked urine?  N  Do you have problems with loss of bowel control?  N  Managing your Medications?  N  Managing your Finances?  N  Housekeeping or managing your Housekeeping?  N    Patient Care Team: Janith Lima, MD as PCP - General (Internal Medicine)  Indicate any recent Medical Services you may have received from other than [facility] providers in the past year (date may be approximate).     Assessment:   This is a routine wellness examination for Patrick Abbott.  Hearing/Vision screen Patient denied any hearing difficulty. No hearing aids. Patient does wear corrective lenses/contacts.   Dietary issues and exercise activities discussed:     Goals Addressed               This Visit's Progress     Patient Stated (pt-stated)        I would like to stay healthy by drinking more water, eating more greens and staying busy.       Depression Screen    12/05/2021    9:45 AM 11/28/2020    3:16 PM 06/26/2020    9:36 AM 05/07/2020   11:12 AM 04/06/2019    2:41 PM 02/24/2019    3:32 PM 08/27/2018    3:59 PM  PHQ 2/9 Scores  PHQ - 2 Score 3 0 0 0 0 0 0  PHQ- 9 Score 4          Fall Risk    12/05/2021    9:45 AM 12/04/2021   11:27 AM  11/28/2020    3:16 PM 06/26/2020    9:36 AM 05/07/2020   11:12 AM  Fall Risk   Falls in the past year?  1 1 0 0 0  Number falls in past yr: 0 0 0 0   Injury with Fall? 0 0 0 0   Risk for fall due to : No Fall Risks      Follow up Falls evaluation completed   Falls evaluation completed;Education provided Falls evaluation completed    [place]:  Any stairs in or around the home? Yes  If so, are there any without handrails? No  Home free of loose throw rugs in walkways, pet beds, electrical cords, etc? Yes  Adequate lighting in your home to reduce risk of falls? Yes   ASSISTIVE DEVICES UTILIZED TO PREVENT FALLS:  Life alert? No  Use of a cane, walker or w/c? No  Grab bars in the bathroom? Yes  Shower chair or bench in shower? Yes  Elevated toilet seat or a handicapped toilet? No   TIMED UP AND GO:  Was the test performed? No .  Length of time to ambulate 10 feet: [name] sec.   Gait steady and fast without use of assistive device  Cognitive Function:  Patient is cogitatively intact.      06/26/2020    9:36 AM 02/24/2019    3:33 PM  6CIT Screen  What Year? 0 points 0 points  What month? 0 points 0 points  What time? 0 points 0 points  Count back from 20 0 points 0 points  Months in reverse 0 points 0 points  Repeat phrase 0 points 0 points  Total Score 0 points 0 points    Immunizations Immunization History  Administered Date(s) Administered   Fluad Quad(high Dose 65+) 02/24/2019, 05/07/2020   Influenza, High Dose Seasonal PF 08/27/2018   Influenza,inj,Quad PF,6+ Mos 04/10/2015, 06/02/2016, 06/12/2017   Influenza-Unspecified 04/02/2021   Moderna Sars-Covid-2 Vaccination 08/06/2019, 09/03/2019   PFIZER(Purple Top)SARS-COV-2 Vaccination 08/06/2019   Pneumococcal Conjugate-13 10/04/2015   Pneumococcal Polysaccharide-23 08/27/2018   Tdap 12/23/2012   Zoster Recombinat (Shingrix) 02/25/2019   Zoster, Live 09/16/2014    TDAP status: Up to  date  Flu Vaccine status: Up to date  Pneumococcal vaccine status: Up to date  Covid-19 vaccine status: Completed vaccines  Qualifies for Shingles Vaccine? Yes   Zostavax completed No   Shingrix Completed?: No.    Education has been provided regarding the importance of this vaccine. Patient has been advised to call insurance company to determine out of pocket expense if they have not yet received this vaccine. Advised may also receive vaccine at local pharmacy or Health Dept. Verbalized acceptance and understanding.  Screening Tests Health Maintenance  Topic Date Due   COVID-19 Vaccine (4 - Mixed Product series) 12/21/2021 (Originally 10/29/2019)   Zoster Vaccines- Shingrix (2 of 2) 03/07/2022 (Originally 04/22/2019)   COLONOSCOPY (Pts 45-24yr Insurance coverage will need to be confirmed)  12/28/2021   INFLUENZA VACCINE  01/21/2022   TETANUS/TDAP  12/24/2022   Pneumonia Vaccine 71 Years old  Completed   Hepatitis C Screening  Completed   HPV VACCINES  Aged Out    Health Maintenance  There are no preventive care reminders to display for this patient.   Colorectal cancer screening: Type of screening: Colonoscopy. Completed 12/29/2018. Repeat every 3 years  Lung Cancer Screening: (Low Dose CT Chest recommended if Age 71-80years, 30 pack-year currently smoking OR have quit w/in 15years.) does not qualify.   Lung Cancer Screening Referral: [name]  Additional Screening:  Hepatitis C Screening: does qualify; Completed 10/04/2015  Vision Screening: Recommended annual ophthalmology  exams for early detection of glaucoma and other disorders of the eye. Is the patient up to date with their annual eye exam?  Yes  Who is the provider or what is the name of the office in which the patient attends annual eye exams? Patient cannot remember the name of the office but states it is on [address]. If pt is not established with a provider, would they like to be referred to a provider to establish care?  No .   Dental Screening: Recommended annual dental exams for proper oral hygiene  Community Resource Referral / Chronic Care Management: CRR required this visit?  No   CCM required this visit?  No      Plan:     I have personally reviewed and noted the following in the patient's chart:   Medical and social history Use of alcohol, tobacco or illicit drugs  Current medications and supplements including opioid prescriptions. Patient is not currently taking opioid prescriptions. Functional ability and status Nutritional status Physical activity Advanced directives List of other physicians Hospitalizations, surgeries, and ER visits in previous 12 months Vitals Screenings to include cognitive, depression, and falls Referrals and appointments  In addition, I have reviewed and discussed with patient certain preventive protocols, quality metrics, and best practice recommendations. A written personalized care plan for preventive services as well as general preventive health recommendations were provided to patient.     Rossie Muskrat, [place]   12/05/2021   Nurse Notes:   Reviewed health maintenance screenings with patient today and relevant education, vaccines, and/or referrals were provided.  Medications reviewed with patient; no opioid use noted.

## 2021-12-05 NOTE — Progress Notes (Signed)
Subjective:  Patient ID: Patrick Abbott, male    DOB: [birth year]  Age: 71 y.o. MRN: [id number]  CC: Annual Exam, Hypertension, Hyperlipidemia, and Back Pain   HPI Patrick Abbott presents for a CPX and f/up -   He complains of a 3-week history of nonradiating low back pain with no history of trauma.  He is controlling the pain with an over-the-counter anti-inflammatory.  He is active and denies chest pain, shortness of breath, diaphoresis, dizziness, lightheadedness, or edema.  Outpatient Medications Prior to Visit  Medication Sig Dispense Refill   amLODipine (NORVASC) 5 MG tablet TAKE 1 TABLET EVERY DAY 90 tablet 1   Calcium Carb-Cholecalciferol (CALCIUM 1000 + D PO) Take by mouth.     pravastatin (PRAVACHOL) 40 MG tablet TAKE 1 TABLET EVERY DAY 90 tablet 1   tamsulosin (FLOMAX) 0.4 MG CAPS capsule Take 1 capsule (0.4 mg total) by mouth daily. 90 capsule 1   VITAMIN D, CHOLECALCIFEROL, PO Take 1 tablet by mouth daily.     No facility-administered medications prior to visit.    ROS Review of Systems  Constitutional: Negative.  Negative for diaphoresis and fatigue.  HENT: Negative.    Eyes: Negative.   Respiratory:  Negative for cough, chest tightness, shortness of breath and wheezing.   Cardiovascular:  Negative for chest pain, palpitations and leg swelling.  Gastrointestinal:  Negative for abdominal pain, constipation, diarrhea, nausea and vomiting.  Endocrine: Negative.   Genitourinary: Negative.  Negative for difficulty urinating.  Musculoskeletal:  Positive for back pain. Negative for arthralgias, joint swelling and myalgias.  Skin:  Negative for color change.  Neurological:  Negative for dizziness, weakness and light-headedness.  Hematological:  Negative for adenopathy. Does not bruise/bleed easily.  Psychiatric/Behavioral: Negative.      Objective:  BP 122/76   Pulse 65   Temp 97.8 F (36.6 C) (Oral)   Ht '5\' 5"'$  (1.651 m)   Wt 139 lb (63 kg)   SpO2 99%   BMI 23.13 kg/m    BP Readings from Last 3 Encounters:  12/05/21 122/76  12/05/21 122/76  09/04/21 124/82    Wt Readings from Last 3 Encounters:  12/05/21 139 lb (63 kg)  12/05/21 139 lb (63 kg)  09/04/21 143 lb (64.9 kg)    Physical Exam Vitals reviewed. Exam conducted with a chaperone present.  HENT:     Nose: Nose normal.     Mouth/Throat:     Mouth: Mucous membranes are moist.  Eyes:     General: No scleral icterus.    Conjunctiva/sclera: Conjunctivae normal.  Cardiovascular:     Rate and Rhythm: Normal rate and regular rhythm.     Heart sounds: No murmur heard. Pulmonary:     Effort: Pulmonary effort is normal.     Breath sounds: No stridor. No wheezing, rhonchi or rales.  Abdominal:     General: Abdomen is flat.     Palpations: There is no mass.     Tenderness: There is no abdominal tenderness. There is no guarding.     Hernia: No hernia is present. There is no hernia in the left inguinal area or right inguinal area.  Genitourinary:    Pubic Area: No rash.      Penis: Normal and circumcised.      Testes: Normal.     Epididymis:     Right: Normal.     Left: Normal.     Prostate: Normal. Not enlarged, not tender and no nodules present.  Rectum: Normal. Guaiac result negative. No mass, tenderness, anal fissure, external hemorrhoid or internal hemorrhoid. Normal anal tone.  Musculoskeletal:        General: Normal range of motion.     Cervical back: Neck supple.     Thoracic back: Normal.     Lumbar back: Normal. No edema, deformity, spasms, tenderness or bony tenderness. Normal range of motion. Negative right straight leg raise test and negative left straight leg raise test.     Right lower leg: No edema.     Left lower leg: No edema.  Lymphadenopathy:     Cervical: No cervical adenopathy.     Lower Body: No right inguinal adenopathy. No left inguinal adenopathy.  Skin:    General: Skin is warm and dry.     Findings: No rash.  Neurological:     General: No focal deficit  present.     Mental Status: He is alert. Mental status is at baseline.  Psychiatric:        Mood and Affect: Mood normal.        Behavior: Behavior normal.     Lab Results  Component Value Date   WBC 6.4 12/05/2021   HGB 13.4 12/05/2021   HCT 41.1 12/05/2021   PLT 228.0 12/05/2021   GLUCOSE 103 (H) 12/05/2021   CHOL 193 12/05/2021   TRIG 230.0 (H) 12/05/2021   HDL 40.80 12/05/2021   LDLDIRECT 102.0 12/05/2021   LDLCALC 75 11/28/2020   ALT 29 12/05/2021   AST 22 12/05/2021   NA 138 12/05/2021   K 5.1 12/05/2021   CL 101 12/05/2021   CREATININE 1.02 12/05/2021   BUN 13 12/05/2021   CO2 31 12/05/2021   TSH 0.84 12/05/2021   PSA 1.74 12/05/2021   HGBA1C 5.1 12/23/2012    Patient was never admitted.  Assessment & Plan:   Patrick Abbott was seen today for annual exam, hypertension, hyperlipidemia and back pain.  Diagnoses and all orders for this visit:  Essential hypertension- His blood pressure is adequately well controlled. -     Basic metabolic panel; Future -     TSH; Future -     Urinalysis, Routine w reflex microscopic; Future -     Hepatic function panel; Future -     CBC with Differential/Platelet; Future -     CBC with Differential/Platelet -     Hepatic function panel -     Urinalysis, Routine w reflex microscopic -     TSH -     Basic metabolic panel  Encounter for general adult medical examination with abnormal findings- Exam completed, labs reviewed, vaccines reviewed and are up-to-date, cancer screenings are up-to-date, patient education was given.  Dyslipidemia, goal LDL below 130- LDL goal achieved. Doing well on the statin  -     Lipid panel; Future -     TSH; Future -     Hepatic function panel; Future -     Hepatic function panel -     TSH -     Lipid panel  Benign prostatic hyperplasia with nocturia- His PSA is normal. -     PSA; Future -     PSA  Acute bilateral low back pain without sciatica- Exam, symptoms, and x-ray are consistent with  degenerative disc disease.  He will continue controlling the pain with NSAIDs. -     DG Lumbar Spine Complete; Future  Other orders -     LDL cholesterol, direct   I am having [employer]  maintain his (VITAMIN D, CHOLECALCIFEROL, PO), Calcium Carb-Cholecalciferol (CALCIUM 1000 + D PO), tamsulosin, amLODipine, and pravastatin.  No orders of the defined types were placed in this encounter.    Follow-up: Return in about 6 months (around 06/06/2022).  Scarlette Calico, MD

## 2021-12-05 NOTE — Patient Instructions (Signed)
Health Maintenance, Male Adopting a healthy lifestyle and getting preventive care are important in promoting health and wellness. Ask your health care provider about: The right schedule for you to have regular tests and exams. Things you can do on your own to prevent diseases and keep yourself healthy. What should I know about diet, weight, and exercise? Eat a healthy diet  Eat a diet that includes plenty of vegetables, fruits, low-fat dairy products, and lean protein. Do not eat a lot of foods that are high in solid fats, added sugars, or sodium. Maintain a healthy weight Body mass index (BMI) is a measurement that can be used to identify possible weight problems. It estimates body fat based on height and weight. Your health care provider can help determine your BMI and help you achieve or maintain a healthy weight. Get regular exercise Get regular exercise. This is one of the most important things you can do for your health. Most adults should: Exercise for at least 150 minutes each week. The exercise should increase your heart rate and make you sweat (moderate-intensity exercise). Do strengthening exercises at least twice a week. This is in addition to the moderate-intensity exercise. Spend less time sitting. Even light physical activity can be beneficial. Watch cholesterol and blood lipids Have your blood tested for lipids and cholesterol at 71 years of age, then have this test every 5 years. You may need to have your cholesterol levels checked more often if: Your lipid or cholesterol levels are high. You are older than 71 years of age. You are at high risk for heart disease. What should I know about cancer screening? Many types of cancers can be detected early and may often be prevented. Depending on your health history and family history, you may need to have cancer screening at various ages. This may include screening for: Colorectal cancer. Prostate cancer. Skin cancer. Lung  cancer. What should I know about heart disease, diabetes, and high blood pressure? Blood pressure and heart disease High blood pressure causes heart disease and increases the risk of stroke. This is more likely to develop in people who have high blood pressure readings or are overweight. Talk with your health care provider about your target blood pressure readings. Have your blood pressure checked: Every 3-5 years if you are 18-39 years of age. Every year if you are 40 years old or older. If you are between the ages of 65 and 75 and are a current or former smoker, ask your health care provider if you should have a one-time screening for abdominal aortic aneurysm (AAA). Diabetes Have regular diabetes screenings. This checks your fasting blood sugar level. Have the screening done: Once every three years after age 45 if you are at a normal weight and have a low risk for diabetes. More often and at a younger age if you are overweight or have a high risk for diabetes. What should I know about preventing infection? Hepatitis B If you have a higher risk for hepatitis B, you should be screened for this virus. Talk with your health care provider to find out if you are at risk for hepatitis B infection. Hepatitis C Blood testing is recommended for: Everyone born from 1945 through 1965. Anyone with known risk factors for hepatitis C. Sexually transmitted infections (STIs) You should be screened each year for STIs, including gonorrhea and chlamydia, if: You are sexually active and are younger than 71 years of age. You are older than 71 years of age and your   health care provider tells you that you are at risk for this type of infection. Your sexual activity has changed since you were last screened, and you are at increased risk for chlamydia or gonorrhea. Ask your health care provider if you are at risk. Ask your health care provider about whether you are at high risk for HIV. Your health care provider  may recommend a prescription medicine to help prevent HIV infection. If you choose to take medicine to prevent HIV, you should first get tested for HIV. You should then be tested every 3 months for as long as you are taking the medicine. Follow these instructions at home: Alcohol use Do not drink alcohol if your health care provider tells you not to drink. If you drink alcohol: Limit how much you have to 0-2 drinks a day. Know how much alcohol is in your drink. In the [place] one drink equals one 12 oz bottle of beer (355 mL), one 5 oz glass of wine (148 mL), or one 1 oz glass of hard liquor (44 mL). Lifestyle Do not use any products that contain nicotine or tobacco. These products include cigarettes, chewing tobacco, and vaping devices, such as e-cigarettes. If you need help quitting, ask your health care provider. Do not use street drugs. Do not share needles. Ask your health care provider for help if you need support or information about quitting drugs. General instructions Schedule regular health, dental, and eye exams. Stay current with your vaccines. Tell your health care provider if: You often feel depressed. You have ever been abused or do not feel safe at home. Summary Adopting a healthy lifestyle and getting preventive care are important in promoting health and wellness. Follow your health care provider's instructions about healthy diet, exercising, and getting tested or screened for diseases. Follow your health care provider's instructions on monitoring your cholesterol and blood pressure. This information is not intended to replace advice given to you by your health care provider. Make sure you discuss any questions you have with your health care provider. Document Revised: 10/29/2020 Document Reviewed: 10/29/2020 Elsevier Patient Education  2023 Elsevier Inc.  

## 2021-12-05 NOTE — Patient Instructions (Signed)
It was great speaking with you today!  Today we reviewed health maintenance screenings and relevant education, vaccines, and/or referrals were provided. Please continue to do your personal lifestyle choices by: daily care of teeth and gums, regular physical activity (goal should be 5 days a week for 30 minutes), eat a healthy diet, avoid tobacco and drug use, limiting any alcohol intake, taking a low-dose aspirin (if not allergic or have been advised by your provider otherwise) and taking vitamins and minerals as recommended by your provider. Continue doing brain stimulating activities (puzzles, reading, adult coloring books, staying active) to keep memory sharp. Continue to eat heart healthy diet (full of fruits, vegetables, whole grains, lean protein, water--limit salt, fat, and sugar intake) and increase physical activity as tolerated.   Please schedule your next Medicare Wellness Visit with your Nurse Health Advisor in 1 year by calling [phone].

## 2022-01-15 DIAGNOSIS — H52223 Regular astigmatism, bilateral: Secondary | ICD-10-CM | POA: Diagnosis not present

## 2022-01-16 ENCOUNTER — Encounter: Payer: Self-pay | Admitting: Gastroenterology

## 2022-01-23 ENCOUNTER — Encounter: Payer: Self-pay | Admitting: Gastroenterology

## 2022-02-14 DIAGNOSIS — H2513 Age-related nuclear cataract, bilateral: Secondary | ICD-10-CM | POA: Diagnosis not present

## 2022-02-25 ENCOUNTER — Ambulatory Visit (AMBULATORY_SURGERY_CENTER): Payer: Self-pay

## 2022-02-25 VITALS — Ht 65.0 in | Wt 137.0 lb

## 2022-02-25 DIAGNOSIS — Z86010 Personal history of colonic polyps: Secondary | ICD-10-CM

## 2022-02-25 MED ORDER — NA SULFATE-K SULFATE-MG SULF 17.5-3.13-1.6 GM/177ML PO SOLN: 1.0000 | Freq: Once | ORAL | 0 refills | Status: AC

## 2022-02-25 NOTE — Progress Notes (Signed)
No egg or soy allergy known to patient  No issues known to pt with past sedation with any surgeries or procedures Patient denies ever being told they had issues or difficulty with intubation  No FH of Malignant Hyperthermia Pt is not on diet pills Pt is not on home 02  Pt is not on blood thinners  Pt denies issues with constipation -occasional constipation but patient states he "drinks more water"  No A fib or A flutter Have any cardiac testing pending--NO Pt instructed to use Singlecare.com or GoodRx for a price reduction on prep

## 2022-03-04 ENCOUNTER — Other Ambulatory Visit: Payer: Self-pay | Admitting: Internal Medicine

## 2022-03-04 DIAGNOSIS — N401 Enlarged prostate with lower urinary tract symptoms: Secondary | ICD-10-CM

## 2022-03-12 DIAGNOSIS — Z008 Encounter for other general examination: Secondary | ICD-10-CM | POA: Diagnosis not present

## 2022-03-12 DIAGNOSIS — E785 Hyperlipidemia, unspecified: Secondary | ICD-10-CM | POA: Diagnosis not present

## 2022-03-12 DIAGNOSIS — Z6822 Body mass index (BMI) 22.0-22.9, adult: Secondary | ICD-10-CM | POA: Diagnosis not present

## 2022-03-12 DIAGNOSIS — I129 Hypertensive chronic kidney disease with stage 1 through stage 4 chronic kidney disease, or unspecified chronic kidney disease: Secondary | ICD-10-CM | POA: Diagnosis not present

## 2022-03-12 DIAGNOSIS — N400 Enlarged prostate without lower urinary tract symptoms: Secondary | ICD-10-CM | POA: Diagnosis not present

## 2022-03-12 DIAGNOSIS — N182 Chronic kidney disease, stage 2 (mild): Secondary | ICD-10-CM | POA: Diagnosis not present

## 2022-03-18 ENCOUNTER — Encounter: Payer: Self-pay | Admitting: Gastroenterology

## 2022-03-24 ENCOUNTER — Encounter: Payer: Self-pay | Admitting: Gastroenterology

## 2022-03-24 ENCOUNTER — Ambulatory Visit (AMBULATORY_SURGERY_CENTER): Payer: No Typology Code available for payment source | Admitting: Gastroenterology

## 2022-03-24 VITALS — BP 100/52 | HR 55 | Temp 96.9°F | Resp 16 | Ht 65.0 in | Wt 137.0 lb

## 2022-03-24 DIAGNOSIS — Z86010 Personal history of colonic polyps: Secondary | ICD-10-CM | POA: Diagnosis not present

## 2022-03-24 DIAGNOSIS — I10 Essential (primary) hypertension: Secondary | ICD-10-CM | POA: Diagnosis not present

## 2022-03-24 DIAGNOSIS — F329 Major depressive disorder, single episode, unspecified: Secondary | ICD-10-CM | POA: Diagnosis not present

## 2022-03-24 DIAGNOSIS — K635 Polyp of colon: Secondary | ICD-10-CM | POA: Diagnosis not present

## 2022-03-24 DIAGNOSIS — Z09 Encounter for follow-up examination after completed treatment for conditions other than malignant neoplasm: Secondary | ICD-10-CM

## 2022-03-24 DIAGNOSIS — D123 Benign neoplasm of transverse colon: Secondary | ICD-10-CM | POA: Diagnosis not present

## 2022-03-24 MED ORDER — SODIUM CHLORIDE 0.9 % IV SOLN: 500.0000 mL | INTRAVENOUS | Status: DC

## 2022-03-24 NOTE — Progress Notes (Signed)
Pt's states no medical or surgical changes since previsit or office visit. 

## 2022-03-24 NOTE — Progress Notes (Signed)
Pt resting comfortably. VSS. Airway intact. SBAR complete to RN. All questions answered.   

## 2022-03-24 NOTE — Progress Notes (Signed)
[facility] History and Physical   Primary Care Physician:  Janith Lima, MD   Reason for Procedure:  History of adenomatous colon polyps  Plan:    Surveillance colonoscopy with possible interventions as needed     HPI: Patrick Abbott is a very pleasant 71 y.o. male here for surveillance colonoscopy. Denies any nausea, vomiting, abdominal pain, melena or bright red blood per rectum  The risks and benefits as well as alternatives of endoscopic procedure(s) have been discussed and reviewed. All questions answered. The patient agrees to proceed.    Past Medical History:  Diagnosis Date   Depression    Hyperlipidemia    on meds   Hypertension    on meds    Past Surgical History:  Procedure Laterality Date   COLONOSCOPY  2020   KN-MAC-suprep(exc)-TA's   HERNIA REPAIR  1992   Left    Prior to Admission medications   Medication Sig Start Date End Date Taking? Authorizing Provider  amLODipine (NORVASC) 5 MG tablet TAKE 1 TABLET EVERY DAY 09/06/21  Yes Janith Lima, MD  Calcium Carb-Cholecalciferol (CALCIUM 1000 + D PO) Take 1 tablet by mouth daily at 6 (six) AM.   Yes [provider]  Omega-3 Fatty Acids (FISH OIL) 1200 MG CAPS Take 1 capsule by mouth daily at 6 (six) AM.   Yes [provider]  pravastatin (PRAVACHOL) 40 MG tablet TAKE 1 TABLET EVERY DAY 09/06/21  Yes Janith Lima, MD  tamsulosin (FLOMAX) 0.4 MG CAPS capsule TAKE 1 CAPSULE BY MOUTH EVERY DAY 03/04/22  Yes Janith Lima, MD    Current Outpatient Medications  Medication Sig Dispense Refill   amLODipine (NORVASC) 5 MG tablet TAKE 1 TABLET EVERY DAY 90 tablet 1   Calcium Carb-Cholecalciferol (CALCIUM 1000 + D PO) Take 1 tablet by mouth daily at 6 (six) AM.     Omega-3 Fatty Acids (FISH OIL) 1200 MG CAPS Take 1 capsule by mouth daily at 6 (six) AM.     pravastatin (PRAVACHOL) 40 MG tablet TAKE 1 TABLET EVERY DAY 90 tablet 1   tamsulosin (FLOMAX) 0.4 MG CAPS capsule TAKE 1  CAPSULE BY MOUTH EVERY DAY 90 capsule 1   Current Facility-Administered Medications  Medication Dose Route Frequency Provider Last Rate Last Admin   0.9 %  sodium chloride infusion  500 mL Intravenous Continuous Teron Blais, Venia Minks, MD        Allergies as of 03/24/2022   (No Known Allergies)    Family History  Problem Relation Age of Onset   Colon cancer Neg Hx    Esophageal cancer Neg Hx    Rectal cancer Neg Hx    Stomach cancer Neg Hx    Colon polyps Neg Hx     Social History   Socioeconomic History   Marital status: Married    Spouse name: Not on file   Number of children: Not on file   Years of education: Not on file   Highest education level: Not on file  Occupational History   Not on file  Tobacco Use   Smoking status: Never   Smokeless tobacco: Never  Vaping Use   Vaping Use: Never used  Substance and Sexual Activity   Alcohol use: Not Currently    Alcohol/week: 0.0 - 2.0 standard drinks of alcohol   Drug use: No   Sexual activity: Yes  Other Topics Concern   Not on file  Social History Narrative   Marital status: married x 32 years;  moved from [place] in 1982.      Children: 3 children; 1 grandchild.      Employment:  Water engineer.      Tobacco: none      Alcohol:  Once per month      Exercise:  Weights, walking.   Social Determinants of Health   Financial Resource Strain: Low Risk  (12/05/2021)   Overall Financial Resource Strain (CARDIA)    Difficulty of Paying Living Expenses: Not hard at all  Food Insecurity: No Food Insecurity (12/05/2021)   Hunger Vital Sign    Worried About Running Out of Food in the Last Year: Never true    Ran Out of Food in the Last Year: Never true  Transportation Needs: No Transportation Needs (12/05/2021)   PRAPARE - Hydrologist (Medical): No    Lack of Transportation (Non-Medical): No  Physical Activity: Sufficiently Active (12/05/2021)   Exercise Vital Sign    Days of Exercise per  Week: 7 days    Minutes of Exercise per Session: 30 min  Stress: No Stress Concern Present (12/05/2021)   [place]    Feeling of Stress : Not at all  Social Connections: Moderately Integrated (12/05/2021)   Social Connection and Isolation Panel [NHANES]    Frequency of Communication with Friends and Family: More than three times a week    Frequency of Social Gatherings with Friends and Family: Once a week    Attends Religious Services: More than 4 times per year    Active Member of [employer] or Organizations: No    Attends Archivist Meetings: Never    Marital Status: Married  Human resources officer Violence: Not At Risk (12/05/2021)   Humiliation, Afraid, Rape, and Kick questionnaire    Fear of Current or Ex-Partner: No    Emotionally Abused: No    Physically Abused: No    Sexually Abused: No    Review of Systems:  All other review of systems negative except as mentioned in the HPI.  Physical Exam: Vital signs in last 24 hours: Blood Pressure 133/81   Pulse (Abnormal) 55   Temperature (Abnormal) 96.9 F (36.1 C)   Respiration (Abnormal) 21   Height _0  (1.651 m)   Weight 137 lb (62.1 kg)   Oxygen Saturation 100%   Body Mass Index 22.80 kg/m  General:   Alert, NAD Lungs:  Clear .   Heart:  Regular rate and rhythm Abdomen:  Soft, nontender and nondistended. Neuro/Psych:  Alert and cooperative. Normal mood and affect. A and O x 3  Reviewed labs, radiology imaging, old records and pertinent past GI work up  Patient is appropriate for planned procedure(s) and anesthesia in an ambulatory setting   K. Denzil Magnuson , MD [phone]

## 2022-03-24 NOTE — Patient Instructions (Signed)
-   Patient has a contact number available for emergencies. The signs and symptoms of potential delayed complications were discussed with the patient. Return to normal activities tomorrow. Written discharge instructions were provided to the patient. - Resume previous diet. - Continue present medications. - Await pathology results. - Repeat colonoscopy in 5 years for surveillance based on pathology results. -Handout on polyps and hemorrhoids provided   YOU HAD AN ENDOSCOPIC PROCEDURE TODAY AT [facility]:   Refer to the procedure report that was given to you for any specific questions about what was found during the examination.  If the procedure report does not answer your questions, please call your gastroenterologist to clarify.  If you requested that your care partner not be given the details of your procedure findings, then the procedure report has been included in a sealed envelope for you to review at your convenience later.  YOU SHOULD EXPECT: Some feelings of bloating in the abdomen. Passage of more gas than usual.  Walking can help get rid of the air that was put into your GI tract during the procedure and reduce the bloating. If you had a lower endoscopy (such as a colonoscopy or flexible sigmoidoscopy) you may notice spotting of blood in your stool or on the toilet paper. If you underwent a bowel prep for your procedure, you may not have a normal bowel movement for a few days.  Please Note:  You might notice some irritation and congestion in your nose or some drainage.  This is from the oxygen used during your procedure.  There is no need for concern and it should clear up in a day or so.  SYMPTOMS TO REPORT IMMEDIATELY:  Following lower endoscopy (colonoscopy or flexible sigmoidoscopy):  Excessive amounts of blood in the stool  Significant tenderness or worsening of abdominal pains  Swelling of the abdomen that is new, acute  Fever of 100F or higher  For urgent or  emergent issues, a gastroenterologist can be reached at any hour by calling [phone]. Do not use MyChart messaging for urgent concerns.    DIET:  We do recommend a small meal at first, but then you may proceed to your regular diet.  Drink plenty of fluids but you should avoid alcoholic beverages for 24 hours.  ACTIVITY:  You should plan to take it easy for the rest of today and you should NOT DRIVE or use heavy machinery until tomorrow (because of the sedation medicines used during the test).    FOLLOW UP: Our staff will call the number listed on your records the next business day following your procedure.  We will call around 7:15- 8:00 am to check on you and address any questions or concerns that you may have regarding the information given to you following your procedure. If we do not reach you, we will leave a message.     If any biopsies were taken you will be contacted by phone or by letter within the next 1-3 weeks.  Please call us at [phone] if you have not heard about the biopsies in 3 weeks.    SIGNATURES/CONFIDENTIALITY: You and/or your care partner have signed paperwork which will be entered into your electronic medical record.  These signatures attest to the fact that that the information above on your After Visit Summary has been reviewed and is understood.  Full responsibility of the confidentiality of this discharge information lies with you and/or your care-partner.

## 2022-03-24 NOTE — Op Note (Signed)
[facility] Patient Name: Patrick Abbott Procedure Date: 03/24/2022 10:33 AM MRN: [id number] Endoscopist: Mauri Pole , MD Age: 71 Referring MD:  Date of Birth: [birth year] Gender: Male Account #: [id number] Procedure:                Colonoscopy Indications:              High risk colon cancer surveillance: Personal                            history of colonic polyps, High risk colon cancer                            surveillance: Personal history of adenoma (10 mm or                            greater in size) Medicines:                Monitored Anesthesia Care Procedure:                Pre-Anesthesia Assessment:                           - Prior to the procedure, a History and Physical                            was performed, and patient medications and                            allergies were reviewed. The patient's tolerance of                            previous anesthesia was also reviewed. The risks                            and benefits of the procedure and the sedation                            options and risks were discussed with the patient.                            All questions were answered, and informed consent                            was obtained. Prior Anticoagulants: The patient has                            taken no previous anticoagulant or antiplatelet                            agents. ASA Grade Assessment: II - A patient with                            mild systemic disease. After reviewing the risks  and benefits, the patient was deemed in                            satisfactory condition to undergo the procedure.                           After obtaining informed consent, the colonoscope                            was passed under direct vision. Throughout the                            procedure, the patient's blood pressure, pulse, and                            oxygen saturations were monitored continuously.  The                            PCF-HQ190L Colonoscope was introduced through the                            anus and advanced to the the cecum, identified by                            appendiceal orifice and ileocecal valve. The                            colonoscopy was performed without difficulty. The                            patient tolerated the procedure well. The quality                            of the bowel preparation was good. The ileocecal                            valve, appendiceal orifice, and rectum were                            photographed. Scope In: 10:56:36 AM Scope Out: 11:08:01 AM Scope Withdrawal Time: 0 hours 7 minutes 28 seconds  Total Procedure Duration: 0 hours 11 minutes 25 seconds  Findings:                 The perianal and digital rectal examinations were                            normal.                           A 1 mm polyp was found in the transverse colon. The                            polyp was sessile. The polyp was removed with a  cold biopsy forceps. Resection and retrieval were                            complete.                           Non-bleeding external and internal hemorrhoids were                            found during retroflexion. The hemorrhoids were                            small. Complications:            No immediate complications. Estimated Blood Loss:     Estimated blood loss was minimal. Impression:               - One 1 mm polyp in the transverse colon, removed                            with a cold biopsy forceps. Resected and retrieved.                           - Non-bleeding external and internal hemorrhoids. Recommendation:           - Patient has a contact number available for                            emergencies. The signs and symptoms of potential                            delayed complications were discussed with the                            patient. Return to normal  activities tomorrow.                            Written discharge instructions were provided to the                            patient.                           - Resume previous diet.                           - Continue present medications.                           - Await pathology results.                           - Repeat colonoscopy in 5 years for surveillance                            based on pathology results. Mauri Pole, MD 03/24/2022 11:15:07 AM This report has been signed electronically.

## 2022-03-24 NOTE — Progress Notes (Signed)
Called to room to assist during endoscopic procedure.  Patient ID and intended procedure confirmed with present staff. Received instructions for my participation in the procedure from the performing physician.  

## 2022-03-25 ENCOUNTER — Telehealth: Payer: Self-pay

## 2022-03-25 NOTE — Telephone Encounter (Signed)
Attempted to reach patient for post-procedure f/u call. No answer. Left message for him to please not hesitate to call us if he has any questions/concerns regarding his care. 

## 2022-04-03 ENCOUNTER — Encounter: Payer: Self-pay | Admitting: Gastroenterology

## 2022-05-19 ENCOUNTER — Other Ambulatory Visit: Payer: Self-pay | Admitting: Internal Medicine

## 2022-05-19 DIAGNOSIS — I10 Essential (primary) hypertension: Secondary | ICD-10-CM

## 2022-05-19 DIAGNOSIS — E7800 Pure hypercholesterolemia, unspecified: Secondary | ICD-10-CM

## 2022-06-04 DIAGNOSIS — H2513 Age-related nuclear cataract, bilateral: Secondary | ICD-10-CM | POA: Diagnosis not present

## 2022-06-04 DIAGNOSIS — H40033 Anatomical narrow angle, bilateral: Secondary | ICD-10-CM | POA: Diagnosis not present

## 2022-06-09 ENCOUNTER — Encounter: Payer: Self-pay | Admitting: Internal Medicine

## 2022-06-09 ENCOUNTER — Other Ambulatory Visit: Payer: No Typology Code available for payment source

## 2022-06-09 ENCOUNTER — Ambulatory Visit (INDEPENDENT_AMBULATORY_CARE_PROVIDER_SITE_OTHER): Payer: No Typology Code available for payment source | Admitting: Internal Medicine

## 2022-06-09 VITALS — BP 128/78 | HR 66 | Temp 97.9°F | Resp 16 | Ht 65.0 in | Wt 140.0 lb

## 2022-06-09 DIAGNOSIS — E7800 Pure hypercholesterolemia, unspecified: Secondary | ICD-10-CM

## 2022-06-09 DIAGNOSIS — R739 Hyperglycemia, unspecified: Secondary | ICD-10-CM

## 2022-06-09 DIAGNOSIS — I10 Essential (primary) hypertension: Secondary | ICD-10-CM | POA: Diagnosis not present

## 2022-06-09 LAB — BASIC METABOLIC PANEL
BUN: 14 mg/dL (ref 6–23)
CO2: 28 mEq/L (ref 19–32)
Calcium: 9.1 mg/dL (ref 8.4–10.5)
Chloride: 100 mEq/L (ref 96–112)
Creatinine, Ser: 1.12 mg/dL (ref 0.40–1.50)
GFR: 65.88 mL/min (ref >60.00)
Glucose, Bld: 96 mg/dL (ref 70–99)
Potassium: 3.8 mEq/L (ref 3.5–5.1)
Sodium: 138 mEq/L (ref 135–145)

## 2022-06-09 NOTE — Progress Notes (Signed)
Subjective:  Patient ID: Patrick Abbott, male    DOB: [birth year]  Age: 71 y.o. MRN: [id number]  CC: Hypertension   HPI Patrick Abbott presents for f/up -  He is active and denies chest pain, shortness of breath, or diaphoresis.  He tells me his blood pressure has been well-controlled.  Outpatient Medications Prior to Visit  Medication Sig Dispense Refill   Calcium Carb-Cholecalciferol (CALCIUM 1000 + D PO) Take 1 tablet by mouth daily at 6 (six) AM.     Omega-3 Fatty Acids (FISH OIL) 1200 MG CAPS Take 1 capsule by mouth daily at 6 (six) AM.     tamsulosin (FLOMAX) 0.4 MG CAPS capsule TAKE 1 CAPSULE BY MOUTH EVERY DAY 90 capsule 1   amLODipine (NORVASC) 5 MG tablet TAKE 1 TABLET BY MOUTH EVERY DAY 90 tablet 0   Na Sulfate-K Sulfate-Mg Sulf 17.5-3.13-1.6 GM/177ML SOLN See admin instructions.     pravastatin (PRAVACHOL) 40 MG tablet TAKE 1 TABLET BY MOUTH EVERY DAY 90 tablet 0   No facility-administered medications prior to visit.    ROS Review of Systems  Constitutional: Negative.  Negative for diaphoresis and fatigue.  HENT: Negative.    Eyes: Negative.   Respiratory:  Negative for chest tightness, shortness of breath and wheezing.   Cardiovascular:  Negative for chest pain, palpitations and leg swelling.  Gastrointestinal:  Negative for abdominal pain, diarrhea, nausea and vomiting.  Endocrine: Negative.   Genitourinary: Negative.  Negative for difficulty urinating.  Musculoskeletal: Negative.  Negative for arthralgias and joint swelling.  Skin: Negative.   Neurological:  Negative for dizziness, weakness and headaches.  Hematological:  Negative for adenopathy. Does not bruise/bleed easily.  Psychiatric/Behavioral: Negative.      Objective:  BP 128/78 (BP Location: Left Arm, Patient Position: Sitting, Cuff Size: Normal)   Pulse 66   Temp 97.9 F (36.6 C) (Oral)   Resp 16   Ht '5\' 5"'$  (1.651 m)   Wt 140 lb (63.5 kg)   SpO2 96%   BMI 23.30 kg/m   BP Readings from Last 3  Encounters:  06/09/22 128/78  03/24/22 (!) 100/52  12/05/21 122/76    Wt Readings from Last 3 Encounters:  06/09/22 140 lb (63.5 kg)  03/24/22 137 lb (62.1 kg)  02/25/22 137 lb (62.1 kg)    Physical Exam Vitals reviewed.  HENT:     Mouth/Throat:     Mouth: Mucous membranes are moist.  Eyes:     General: No scleral icterus.    Conjunctiva/sclera: Conjunctivae normal.  Cardiovascular:     Rate and Rhythm: Normal rate and regular rhythm.     Heart sounds: No murmur heard. Pulmonary:     Effort: Pulmonary effort is normal.     Breath sounds: No stridor. No wheezing, rhonchi or rales.  Abdominal:     General: Abdomen is flat.     Palpations: There is no mass.     Tenderness: There is no abdominal tenderness. There is no guarding.     Hernia: No hernia is present.  Musculoskeletal:        General: Normal range of motion.     Cervical back: Neck supple.     Right lower leg: No edema.     Left lower leg: No edema.  Lymphadenopathy:     Cervical: No cervical adenopathy.  Skin:    General: Skin is warm and dry.  Neurological:     General: No focal deficit present.     Mental Status: He  is alert. Mental status is at baseline.  Psychiatric:        Mood and Affect: Mood normal.        Behavior: Behavior normal.     Lab Results  Component Value Date   WBC 6.4 12/05/2021   HGB 13.4 12/05/2021   HCT 41.1 12/05/2021   PLT 228.0 12/05/2021   GLUCOSE 96 06/09/2022   CHOL 193 12/05/2021   TRIG 230.0 (H) 12/05/2021   HDL 40.80 12/05/2021   LDLDIRECT 102.0 12/05/2021   LDLCALC 75 11/28/2020   ALT 29 12/05/2021   AST 22 12/05/2021   NA 138 06/09/2022   K 3.8 06/09/2022   CL 100 06/09/2022   CREATININE 1.12 06/09/2022   BUN 14 06/09/2022   CO2 28 06/09/2022   TSH 0.84 12/05/2021   PSA 1.74 12/05/2021   HGBA1C 5.8 (H) 06/09/2022    Patient was never admitted.  Assessment & Plan:   Patrick Abbott was seen today for hypertension.  Diagnoses and all orders for this  visit:  Hyperglycemia- He has mild prediabetes.  Medical therapy is not indicated. -     Basic metabolic panel; Future -     Hemoglobin A1c; Future -     Hemoglobin A1c -     Basic metabolic panel  Essential hypertension- His blood pressure is adequately well-controlled. -     Basic metabolic panel; Future -     Basic metabolic panel -     amLODipine (NORVASC) 5 MG tablet; TAKE 1 TABLET BY MOUTH EVERY DAY  Pure hypercholesterolemia- LDL goal achieved. Doing well on the statin  -     pravastatin (PRAVACHOL) 40 MG tablet; TAKE 1 TABLET BY MOUTH EVERY DAY   I have discontinued Patrick Abbott's Na Sulfate-K Sulfate-Mg Sulf. I am also having him maintain his Calcium Carb-Cholecalciferol (CALCIUM 1000 + D PO), Fish Oil, tamsulosin, pravastatin, and amLODipine.  Meds ordered this encounter  Medications   pravastatin (PRAVACHOL) 40 MG tablet    Sig: TAKE 1 TABLET BY MOUTH EVERY DAY    Dispense:  90 tablet    Refill:  1   amLODipine (NORVASC) 5 MG tablet    Sig: TAKE 1 TABLET BY MOUTH EVERY DAY    Dispense:  90 tablet    Refill:  1     Follow-up: No follow-ups on file.  Scarlette Calico, MD

## 2022-06-09 NOTE — Patient Instructions (Signed)
Hypertension, Adult High blood pressure (hypertension) is when the force of blood pumping through the arteries is too strong. The arteries are the blood vessels that carry blood from the heart throughout the body. Hypertension forces the heart to work harder to pump blood and may cause arteries to become narrow or stiff. Untreated or uncontrolled hypertension can lead to a heart attack, heart failure, a stroke, kidney disease, and other problems. A blood pressure reading consists of a higher number over a lower number. Ideally, your blood pressure should be below 120/80. The first ("top") number is called the systolic pressure. It is a measure of the pressure in your arteries as your heart beats. The second ("bottom") number is called the diastolic pressure. It is a measure of the pressure in your arteries as the heart relaxes. What are the causes? The exact cause of this condition is not known. There are some conditions that result in high blood pressure. What increases the risk? Certain factors may make you more likely to develop high blood pressure. Some of these risk factors are under your control, including: Smoking. Not getting enough exercise or physical activity. Being overweight. Having too much fat, sugar, calories, or salt (sodium) in your diet. Drinking too much alcohol. Other risk factors include: Having a personal history of heart disease, diabetes, high cholesterol, or kidney disease. Stress. Having a family history of high blood pressure and high cholesterol. Having obstructive sleep apnea. Age. The risk increases with age. What are the signs or symptoms? High blood pressure may not cause symptoms. Very high blood pressure (hypertensive crisis) may cause: Headache. Fast or irregular heartbeats (palpitations). Shortness of breath. Nosebleed. Nausea and vomiting. Vision changes. Severe chest pain, dizziness, and seizures. How is this diagnosed? This condition is diagnosed by  measuring your blood pressure while you are seated, with your arm resting on a flat surface, your legs uncrossed, and your feet flat on the floor. The cuff of the blood pressure monitor will be placed directly against the skin of your upper arm at the level of your heart. Blood pressure should be measured at least twice using the same arm. Certain conditions can cause a difference in blood pressure between your right and left arms. If you have a high blood pressure reading during one visit or you have normal blood pressure with other risk factors, you may be asked to: Return on a different day to have your blood pressure checked again. Monitor your blood pressure at home for 1 week or longer. If you are diagnosed with hypertension, you may have other blood or imaging tests to help your health care provider understand your overall risk for other conditions. How is this treated? This condition is treated by making healthy lifestyle changes, such as eating healthy foods, exercising more, and reducing your alcohol intake. You may be referred for counseling on a healthy diet and physical activity. Your health care provider may prescribe medicine if lifestyle changes are not enough to get your blood pressure under control and if: Your systolic blood pressure is above 130. Your diastolic blood pressure is above 80. Your personal target blood pressure may vary depending on your medical conditions, your age, and other factors. Follow these instructions at home: Eating and drinking  Eat a diet that is high in fiber and potassium, and low in sodium, added sugar, and fat. An example of this eating plan is called the DASH diet. DASH stands for Dietary Approaches to Stop Hypertension. To eat this way: Eat   plenty of fresh fruits and vegetables. Try to fill one half of your plate at each meal with fruits and vegetables. Eat whole grains, such as whole-wheat pasta, brown rice, or whole-grain bread. Fill about one  fourth of your plate with whole grains. Eat or drink low-fat dairy products, such as skim milk or low-fat yogurt. Avoid fatty cuts of meat, processed or cured meats, and poultry with skin. Fill about one fourth of your plate with lean proteins, such as fish, chicken without skin, beans, eggs, or tofu. Avoid pre-made and processed foods. These tend to be higher in sodium, added sugar, and fat. Reduce your daily sodium intake. Many people with hypertension should eat less than 1,500 mg of sodium a day. Do not drink alcohol if: Your health care provider tells you not to drink. You are pregnant, may be pregnant, or are planning to become pregnant. If you drink alcohol: Limit how much you have to: 0-1 drink a day for women. 0-2 drinks a day for men. Know how much alcohol is in your drink. In the [place] one drink equals one 12 oz bottle of beer (355 mL), one 5 oz glass of wine (148 mL), or one 1 oz glass of hard liquor (44 mL). Lifestyle  Work with your health care provider to maintain a healthy body weight or to lose weight. Ask what an ideal weight is for you. Get at least 30 minutes of exercise that causes your heart to beat faster (aerobic exercise) most days of the week. Activities may include walking, swimming, or biking. Include exercise to strengthen your muscles (resistance exercise), such as Pilates or lifting weights, as part of your weekly exercise routine. Try to do these types of exercises for 30 minutes at least 3 days a week. Do not use any products that contain nicotine or tobacco. These products include cigarettes, chewing tobacco, and vaping devices, such as e-cigarettes. If you need help quitting, ask your health care provider. Monitor your blood pressure at home as told by your health care provider. Keep all follow-up visits. This is important. Medicines Take over-the-counter and prescription medicines only as told by your health care provider. Follow directions carefully. Blood  pressure medicines must be taken as prescribed. Do not skip doses of blood pressure medicine. Doing this puts you at risk for problems and can make the medicine less effective. Ask your health care provider about side effects or reactions to medicines that you should watch for. Contact a health care provider if you: Think you are having a reaction to a medicine you are taking. Have headaches that keep coming back (recurring). Feel dizzy. Have swelling in your ankles. Have trouble with your vision. Get help right away if you: Develop a severe headache or confusion. Have unusual weakness or numbness. Feel faint. Have severe pain in your chest or abdomen. Vomit repeatedly. Have trouble breathing. These symptoms may be an emergency. Get help right away. Call 911. Do not wait to see if the symptoms will go away. Do not drive yourself to the hospital. Summary Hypertension is when the force of blood pumping through your arteries is too strong. If this condition is not controlled, it may put you at risk for serious complications. Your personal target blood pressure may vary depending on your medical conditions, your age, and other factors. For most people, a normal blood pressure is less than 120/80. Hypertension is treated with lifestyle changes, medicines, or a combination of both. Lifestyle changes include losing weight, eating a healthy,   low-sodium diet, exercising more, and limiting alcohol. This information is not intended to replace advice given to you by your health care provider. Make sure you discuss any questions you have with your health care provider. Document Revised: 04/16/2021 Document Reviewed: 04/16/2021 Elsevier Patient Education  2023 [employer]  

## 2022-06-10 LAB — HEMOGLOBIN A1C
Hgb A1c MFr Bld: 5.8 % of total Hgb — ABNORMAL HIGH (ref <5.7)
Mean Plasma Glucose: 120 mg/dL
eAG (mmol/L): 6.6 mmol/L

## 2022-06-14 MED ORDER — AMLODIPINE BESYLATE 5 MG PO TABS: ORAL_TABLET | ORAL | 1 refills | Status: DC

## 2022-06-14 MED ORDER — PRAVASTATIN SODIUM 40 MG PO TABS: ORAL_TABLET | ORAL | 1 refills | Status: DC

## 2022-07-21 DIAGNOSIS — H40033 Anatomical narrow angle, bilateral: Secondary | ICD-10-CM | POA: Diagnosis not present

## 2022-08-30 ENCOUNTER — Other Ambulatory Visit: Payer: Self-pay | Admitting: Internal Medicine

## 2022-08-30 DIAGNOSIS — N401 Enlarged prostate with lower urinary tract symptoms: Secondary | ICD-10-CM

## 2022-09-05 DIAGNOSIS — H5203 Hypermetropia, bilateral: Secondary | ICD-10-CM | POA: Diagnosis not present

## 2022-09-05 DIAGNOSIS — H524 Presbyopia: Secondary | ICD-10-CM | POA: Diagnosis not present

## 2022-09-05 DIAGNOSIS — H52223 Regular astigmatism, bilateral: Secondary | ICD-10-CM | POA: Diagnosis not present

## 2022-12-09 ENCOUNTER — Encounter: Payer: Self-pay | Admitting: Internal Medicine

## 2022-12-09 ENCOUNTER — Ambulatory Visit: Payer: No Typology Code available for payment source | Admitting: Internal Medicine

## 2022-12-09 VITALS — BP 110/68 | HR 64 | Temp 97.8°F | Ht 65.0 in | Wt 138.0 lb

## 2022-12-09 DIAGNOSIS — R001 Bradycardia, unspecified: Secondary | ICD-10-CM

## 2022-12-09 DIAGNOSIS — Z23 Encounter for immunization: Secondary | ICD-10-CM

## 2022-12-09 DIAGNOSIS — E7800 Pure hypercholesterolemia, unspecified: Secondary | ICD-10-CM

## 2022-12-09 DIAGNOSIS — R351 Nocturia: Secondary | ICD-10-CM

## 2022-12-09 DIAGNOSIS — I10 Essential (primary) hypertension: Secondary | ICD-10-CM

## 2022-12-09 DIAGNOSIS — Z0000 Encounter for general adult medical examination without abnormal findings: Secondary | ICD-10-CM | POA: Diagnosis not present

## 2022-12-09 DIAGNOSIS — Z0001 Encounter for general adult medical examination with abnormal findings: Secondary | ICD-10-CM

## 2022-12-09 DIAGNOSIS — N401 Enlarged prostate with lower urinary tract symptoms: Secondary | ICD-10-CM

## 2022-12-09 DIAGNOSIS — E785 Hyperlipidemia, unspecified: Secondary | ICD-10-CM

## 2022-12-09 LAB — CBC WITH DIFFERENTIAL/PLATELET
Basophils Absolute: 0 10*3/uL (ref 0.0–0.1)
Basophils Relative: 0.6 % (ref 0.0–3.0)
Eosinophils Absolute: 0.1 10*3/uL (ref 0.0–0.7)
Eosinophils Relative: 1.9 % (ref 0.0–5.0)
HCT: 40.8 % (ref 39.0–52.0)
Hemoglobin: 13.3 g/dL (ref 13.0–17.0)
Lymphocytes Relative: 33 % (ref 12.0–46.0)
Lymphs Abs: 1.9 10*3/uL (ref 0.7–4.0)
MCHC: 32.7 g/dL (ref 30.0–36.0)
MCV: 80.8 fl (ref 78.0–100.0)
Monocytes Absolute: 0.4 10*3/uL (ref 0.1–1.0)
Monocytes Relative: 6.3 % (ref 3.0–12.0)
Neutro Abs: 3.3 10*3/uL (ref 1.4–7.7)
Neutrophils Relative %: 58.2 % (ref 43.0–77.0)
Platelets: 251 10*3/uL (ref 150.0–400.0)
RBC: 5.05 Mil/uL (ref 4.22–5.81)
RDW: 13.7 % (ref 11.5–15.5)
WBC: 5.7 10*3/uL (ref 4.0–10.5)

## 2022-12-09 LAB — LIPID PANEL
Cholesterol: 173 mg/dL (ref 0–200)
HDL: 44.6 mg/dL (ref >39.00)
LDL Cholesterol: 108 mg/dL — ABNORMAL HIGH (ref 0–99)
NonHDL: 128.58
Total CHOL/HDL Ratio: 4
Triglycerides: 102 mg/dL (ref 0.0–149.0)
VLDL: 20.4 mg/dL (ref 0.0–40.0)

## 2022-12-09 LAB — BASIC METABOLIC PANEL
BUN: 16 mg/dL (ref 6–23)
CO2: 29 mEq/L (ref 19–32)
Calcium: 9.3 mg/dL (ref 8.4–10.5)
Chloride: 101 mEq/L (ref 96–112)
Creatinine, Ser: 1.08 mg/dL (ref 0.40–1.50)
GFR: 68.58 mL/min (ref >60.00)
Glucose, Bld: 93 mg/dL (ref 70–99)
Potassium: 4.6 mEq/L (ref 3.5–5.1)
Sodium: 137 mEq/L (ref 135–145)

## 2022-12-09 LAB — HEPATIC FUNCTION PANEL
ALT: 20 U/L (ref 0–53)
AST: 19 U/L (ref 0–37)
Albumin: 4.4 g/dL (ref 3.5–5.2)
Alkaline Phosphatase: 58 U/L (ref 39–117)
Bilirubin, Direct: 0.1 mg/dL (ref 0.0–0.3)
Total Bilirubin: 0.7 mg/dL (ref 0.2–1.2)
Total Protein: 8 g/dL (ref 6.0–8.3)

## 2022-12-09 LAB — URINALYSIS, ROUTINE W REFLEX MICROSCOPIC
Bilirubin Urine: NEGATIVE
Hgb urine dipstick: NEGATIVE
Ketones, ur: NEGATIVE
Leukocytes,Ua: NEGATIVE
Nitrite: NEGATIVE
RBC / HPF: NONE SEEN (ref 0–?)
Specific Gravity, Urine: 1.015 (ref 1.000–1.030)
Total Protein, Urine: NEGATIVE
Urine Glucose: NEGATIVE
Urobilinogen, UA: 0.2 (ref 0.0–1.0)
pH: 7 (ref 5.0–8.0)

## 2022-12-09 LAB — PSA: PSA: 2.08 ng/mL (ref 0.10–4.00)

## 2022-12-09 LAB — TSH: TSH: 0.92 u[IU]/mL (ref 0.35–5.50)

## 2022-12-09 MED ORDER — BOOSTRIX 5-2.5-18.5 LF-MCG/0.5 IM SUSP
0.5000 mL | Freq: Once | INTRAMUSCULAR | 0 refills | Status: AC
Start: 2022-12-09 — End: 2022-12-09

## 2022-12-09 MED ORDER — PRAVASTATIN SODIUM 40 MG PO TABS: ORAL_TABLET | ORAL | 1 refills | Status: DC

## 2022-12-09 MED ORDER — TAMSULOSIN HCL 0.4 MG PO CAPS: 0.4000 mg | ORAL_CAPSULE | Freq: Every day | ORAL | 1 refills | Status: DC

## 2022-12-09 NOTE — Progress Notes (Signed)
Subjective:  Patient ID: Patrick Abbott, male    DOB: 1950-06-25  Age: 72 y.o. MRN: [id number]  CC: Annual Exam, Hypertension, and Hyperlipidemia   HPI Patrick Abbott presents for a CPX and f/up ---  Discussed the use of AI scribe software for clinical note transcription with the patient, who gave verbal consent to proceed.  History of Present Illness   The patient, with a history of BPH, presents for a routine six-month follow-up. He reports occasional dizziness, particularly when changing positions. Despite this, he maintains an active lifestyle, engaging in regular walking and gardening activities. He denies any chest pain, palpitations, or shortness of breath.  He denies any gastrointestinal symptoms and reports a diet rich in green vegetables. There is no reported swelling or pain in the feet or ankles.  Regarding his BPH, he reports nocturia, waking three to four times per night to urinate. He also describes occasional straining to initiate urination but denies hematuria or dysuria.       Outpatient Medications Prior to Visit  Medication Sig Dispense Refill   Omega-3 Fatty Acids (FISH OIL) 1200 MG CAPS Take 1 capsule by mouth daily at 6 (six) AM.     amLODipine (NORVASC) 5 MG tablet TAKE 1 TABLET BY MOUTH EVERY DAY 90 tablet 1   Calcium Carb-Cholecalciferol (CALCIUM 1000 + D PO) Take 1 tablet by mouth daily at 6 (six) AM.     pravastatin (PRAVACHOL) 40 MG tablet TAKE 1 TABLET BY MOUTH EVERY DAY 90 tablet 1   tamsulosin (FLOMAX) 0.4 MG CAPS capsule TAKE 1 CAPSULE BY MOUTH EVERY DAY 90 capsule 1   No facility-administered medications prior to visit.    ROS Review of Systems  Constitutional: Negative.  Negative for appetite change, diaphoresis, fatigue and unexpected weight change.  HENT: Negative.    Eyes: Negative.   Respiratory: Negative.  Negative for cough, chest tightness, shortness of breath and wheezing.   Cardiovascular:  Negative for chest pain, palpitations and leg  swelling.  Gastrointestinal:  Negative for abdominal pain, constipation, diarrhea, nausea and vomiting.  Endocrine: Negative.   Genitourinary:  Positive for difficulty urinating.  Musculoskeletal: Negative.   Skin: Negative.   Neurological:  Positive for dizziness. Negative for syncope, weakness and light-headedness.  Hematological:  Negative for adenopathy. Does not bruise/bleed easily.  Psychiatric/Behavioral: Negative.      Objective:  BP 110/68 (BP Location: Right Arm, Patient Position: Sitting, Cuff Size: Normal)   Pulse 64   Temp 97.8 F (36.6 C) (Oral)   Ht 5\' 5"  (1.651 m)   Wt 138 lb (62.6 kg)   SpO2 97%   BMI 22.96 kg/m   BP Readings from Last 3 Encounters:  12/09/22 110/68  06/09/22 128/78  03/24/22 (!) 100/52    Wt Readings from Last 3 Encounters:  12/09/22 138 lb (62.6 kg)  06/09/22 140 lb (63.5 kg)  03/24/22 137 lb (62.1 kg)    Physical Exam Vitals reviewed.  Constitutional:      Appearance: Normal appearance.  HENT:     Nose: Nose normal.     Mouth/Throat:     Mouth: Mucous membranes are moist.  Eyes:     General: No scleral icterus.    Conjunctiva/sclera: Conjunctivae normal.  Cardiovascular:     Rate and Rhythm: Normal rate and regular rhythm.     Heart sounds: No murmur heard.    No friction rub. No gallop.     Comments: EKG- SR with 1 st degree AB block, 65 bpm NS  IV block No LVH, Q waves, or ST/T wave changes Pulmonary:     Effort: Pulmonary effort is normal.     Breath sounds: No stridor. No wheezing, rhonchi or rales.  Abdominal:     General: Abdomen is flat.     Palpations: There is no mass.     Tenderness: There is no abdominal tenderness. There is no guarding.     Hernia: No hernia is present.  Musculoskeletal:        General: Normal range of motion.     Cervical back: Neck supple.     Right lower leg: No edema.     Left lower leg: No edema.  Lymphadenopathy:     Cervical: No cervical adenopathy.  Skin:    General: Skin is  warm and dry.  Neurological:     General: No focal deficit present.     Mental Status: He is alert. Mental status is at baseline.  Psychiatric:        Mood and Affect: Mood normal.        Behavior: Behavior normal.     Lab Results  Component Value Date   WBC 5.7 12/09/2022   HGB 13.3 12/09/2022   HCT 40.8 12/09/2022   PLT 251.0 12/09/2022   GLUCOSE 93 12/09/2022   CHOL 173 12/09/2022   TRIG 102.0 12/09/2022   HDL 44.60 12/09/2022   LDLDIRECT 102.0 12/05/2021   LDLCALC 108 (H) 12/09/2022   ALT 20 12/09/2022   AST 19 12/09/2022   NA 137 12/09/2022   K 4.6 12/09/2022   CL 101 12/09/2022   CREATININE 1.08 12/09/2022   BUN 16 12/09/2022   CO2 29 12/09/2022   TSH 0.92 12/09/2022   PSA 2.08 12/09/2022   HGBA1C 5.8 (H) 06/09/2022    Patient was never admitted.  Assessment & Plan:   Benign prostatic hyperplasia with nocturia -     PSA; Future -     Urinalysis, Routine w reflex microscopic; Future -     Tamsulosin HCl; Take 1 capsule (0.4 mg total) by mouth daily.  Dispense: 90 capsule; Refill: 1  Chronic sinus bradycardia- His heart rate is normal now. -     TSH; Future -     EKG 12-Lead  Dyslipidemia, goal LDL below 130 -     Lipid panel; Future -     TSH; Future -     Hepatic function panel; Future -     Pravastatin Sodium; TAKE 1 TABLET BY MOUTH EVERY DAY  Dispense: 90 tablet; Refill: 1  Encounter for general adult medical examination with abnormal findings- Exam completed, labs reviewed, vaccines reviewed and updated, cancer screenings are up-to-date, patient education was given.  Essential hypertension- His blood pressure is overcontrolled and he is symptomatic.  Will discontinue the CCB. -     TSH; Future -     CBC with Differential/Platelet; Future -     Basic metabolic panel; Future -     Hepatic function panel; Future -     EKG 12-Lead  Need for prophylactic vaccination with combined diphtheria-tetanus-pertussis (DTP) vaccine -     Boostrix; Inject 0.5  mLs into the muscle once for 1 dose.  Dispense: 0.5 mL; Refill: 0     Follow-up: Return in about 6 months (around 06/10/2023).  Sanda Linger, MD

## 2022-12-09 NOTE — Patient Instructions (Signed)
Health Maintenance, Male Adopting a healthy lifestyle and getting preventive care are important in promoting health and wellness. Ask your health care provider about: The right schedule for you to have regular tests and exams. Things you can do on your own to prevent diseases and keep yourself healthy. What should I know about diet, weight, and exercise? Eat a healthy diet  Eat a diet that includes plenty of vegetables, fruits, low-fat dairy products, and lean protein. Do not eat a lot of foods that are high in solid fats, added sugars, or sodium. Maintain a healthy weight Body mass index (BMI) is a measurement that can be used to identify possible weight problems. It estimates body fat based on height and weight. Your health care provider can help determine your BMI and help you achieve or maintain a healthy weight. Get regular exercise Get regular exercise. This is one of the most important things you can do for your health. Most adults should: Exercise for at least 150 minutes each week. The exercise should increase your heart rate and make you sweat (moderate-intensity exercise). Do strengthening exercises at least twice a week. This is in addition to the moderate-intensity exercise. Spend less time sitting. Even light physical activity can be beneficial. Watch cholesterol and blood lipids Have your blood tested for lipids and cholesterol at 72 years of age, then have this test every 5 years. You may need to have your cholesterol levels checked more often if: Your lipid or cholesterol levels are high. You are older than 72 years of age. You are at high risk for heart disease. What should I know about cancer screening? Many types of cancers can be detected early and may often be prevented. Depending on your health history and family history, you may need to have cancer screening at various ages. This may include screening for: Colorectal cancer. Prostate cancer. Skin cancer. Lung  cancer. What should I know about heart disease, diabetes, and high blood pressure? Blood pressure and heart disease High blood pressure causes heart disease and increases the risk of stroke. This is more likely to develop in people who have high blood pressure readings or are overweight. Talk with your health care provider about your target blood pressure readings. Have your blood pressure checked: Every 3-5 years if you are 18-39 years of age. Every year if you are 40 years old or older. If you are between the ages of 65 and 75 and are a current or former smoker, ask your health care provider if you should have a one-time screening for abdominal aortic aneurysm (AAA). Diabetes Have regular diabetes screenings. This checks your fasting blood sugar level. Have the screening done: Once every three years after age 45 if you are at a normal weight and have a low risk for diabetes. More often and at a younger age if you are overweight or have a high risk for diabetes. What should I know about preventing infection? Hepatitis B If you have a higher risk for hepatitis B, you should be screened for this virus. Talk with your health care provider to find out if you are at risk for hepatitis B infection. Hepatitis C Blood testing is recommended for: Everyone born from 1945 through 1965. Anyone with known risk factors for hepatitis C. Sexually transmitted infections (STIs) You should be screened each year for STIs, including gonorrhea and chlamydia, if: You are sexually active and are younger than 72 years of age. You are older than 72 years of age and your   health care provider tells you that you are at risk for this type of infection. Your sexual activity has changed since you were last screened, and you are at increased risk for chlamydia or gonorrhea. Ask your health care provider if you are at risk. Ask your health care provider about whether you are at high risk for HIV. Your health care provider  may recommend a prescription medicine to help prevent HIV infection. If you choose to take medicine to prevent HIV, you should first get tested for HIV. You should then be tested every 3 months for as long as you are taking the medicine. Follow these instructions at home: Alcohol use Do not drink alcohol if your health care provider tells you not to drink. If you drink alcohol: Limit how much you have to 0-2 drinks a day. Know how much alcohol is in your drink. In the [place] one drink equals one 12 oz bottle of beer (355 mL), one 5 oz glass of wine (148 mL), or one 1 oz glass of hard liquor (44 mL). Lifestyle Do not use any products that contain nicotine or tobacco. These products include cigarettes, chewing tobacco, and vaping devices, such as e-cigarettes. If you need help quitting, ask your health care provider. Do not use street drugs. Do not share needles. Ask your health care provider for help if you need support or information about quitting drugs. General instructions Schedule regular health, dental, and eye exams. Stay current with your vaccines. Tell your health care provider if: You often feel depressed. You have ever been abused or do not feel safe at home. Summary Adopting a healthy lifestyle and getting preventive care are important in promoting health and wellness. Follow your health care provider's instructions about healthy diet, exercising, and getting tested or screened for diseases. Follow your health care provider's instructions on monitoring your cholesterol and blood pressure. This information is not intended to replace advice given to you by your health care provider. Make sure you discuss any questions you have with your health care provider. Document Revised: 10/29/2020 Document Reviewed: 10/29/2020 Elsevier Patient Education  2024 Elsevier Inc.  

## 2022-12-12 DIAGNOSIS — Z23 Encounter for immunization: Secondary | ICD-10-CM | POA: Insufficient documentation

## 2023-02-03 ENCOUNTER — Other Ambulatory Visit: Payer: Self-pay | Admitting: Internal Medicine

## 2023-02-03 DIAGNOSIS — I10 Essential (primary) hypertension: Secondary | ICD-10-CM

## 2023-02-20 ENCOUNTER — Ambulatory Visit: Payer: No Typology Code available for payment source | Admitting: *Deleted

## 2023-02-20 VITALS — Wt 135.0 lb

## 2023-02-20 DIAGNOSIS — Z0000 Encounter for general adult medical examination without abnormal findings: Secondary | ICD-10-CM

## 2023-02-20 NOTE — Progress Notes (Signed)
Subjective:   Icholas Abbott is a 72 y.o. male who presents for Medicare Annual/Subsequent preventive examination.  Visit Complete: Virtual  I connected with  Ronni Rumble on 02/20/23 by a audio enabled telemedicine application and verified that I am speaking with the correct person using two identifiers.  Patient Location: Home  Provider Location: Office/Clinic  I discussed the limitations of evaluation and management by telemedicine. The patient expressed understanding and agreed to proceed.  Patient Medicare AWV questionnaire was completed by the patient on 02/16/2023; I have confirmed that all information answered by patient is correct and no changes since this date.  Review of Systems    Defer to PCP Cardiac Risk Factors include: advanced age (>31men, >58 women)    Please see problem list for additional risk factors   Vital Signs: Unable to obtain new vitals due to this being a telehealth visit.  Objective:    Today's Vitals   02/20/23 1111  Weight: 135 lb (61.2 kg)   Body mass index is 22.47 kg/m.     02/20/2023   11:16 AM 06/26/2020    9:40 AM  Advanced Directives  Does Patient Have a Medical Advance Directive? No No  Would patient like information on creating a medical advance directive? No - Patient declined No - Patient declined    Current Medications (verified) Outpatient Encounter Medications as of 02/20/2023  Medication Sig   Omega-3 Fatty Acids (FISH OIL) 1200 MG CAPS Take 1 capsule by mouth daily at 6 (six) AM.   pravastatin (PRAVACHOL) 40 MG tablet TAKE 1 TABLET BY MOUTH EVERY DAY   tamsulosin (FLOMAX) 0.4 MG CAPS capsule Take 1 capsule (0.4 mg total) by mouth daily.   No facility-administered encounter medications on file as of 02/20/2023.    Allergies (verified) Patient has no known allergies.   History: Past Medical History:  Diagnosis Date   Depression    Hyperlipidemia    on meds   Hypertension    on meds   Past Surgical History:  Procedure  Laterality Date   COLONOSCOPY  2020   KN-MAC-suprep(exc)-TA's   HERNIA REPAIR  1992   Left   Family History  Problem Relation Age of Onset   Colon cancer Neg Hx    Esophageal cancer Neg Hx    Rectal cancer Neg Hx    Stomach cancer Neg Hx    Colon polyps Neg Hx    Social History   Socioeconomic History   Marital status: Married    Spouse name: Not on file   Number of children: Not on file   Years of education: Not on file   Highest education level: 3rd grade  Occupational History   Not on file  Tobacco Use   Smoking status: Never   Smokeless tobacco: Never  Vaping Use   Vaping status: Never Used  Substance and Sexual Activity   Alcohol use: Not Currently    Alcohol/week: 0.0 - 2.0 standard drinks of alcohol   Drug use: No   Sexual activity: Yes  Other Topics Concern   Not on file  Social History Narrative   Marital status: married x 32 years; moved from [place] in 1982.      Children: 3 children; 1 grandchild.      Employment:  Optician, dispensing.      Tobacco: none      Alcohol:  Once per month      Exercise:  Weights, walking.   Social Determinants of Health   Financial Resource Strain:  Low Risk  (02/20/2023)   Overall Financial Resource Strain (CARDIA)    Difficulty of Paying Living Expenses: Not hard at all  Food Insecurity: No Food Insecurity (02/20/2023)   Hunger Vital Sign    Worried About Running Out of Food in the Last Year: Never true    Ran Out of Food in the Last Year: Never true  Recent Concern: Food Insecurity - Food Insecurity Present (12/05/2022)   Hunger Vital Sign    Worried About Running Out of Food in the Last Year: Sometimes true    Ran Out of Food in the Last Year: Sometimes true  Transportation Needs: No Transportation Needs (02/20/2023)   PRAPARE - Administrator, Civil Service (Medical): No    Lack of Transportation (Non-Medical): No  Physical Activity: Insufficiently Active (02/20/2023)   Exercise Vital Sign    Days of  Exercise per Week: 3 days    Minutes of Exercise per Session: 40 min  Stress: No Stress Concern Present (02/20/2023)   [employer] of Occupational Health - Occupational Stress Questionnaire    Feeling of Stress : Only a little  Social Connections: Moderately Integrated (02/20/2023)   Social Connection and Isolation Panel [NHANES]    Frequency of Communication with Friends and Family: Once a week    Frequency of Social Gatherings with Friends and Family: Once a week    Attends Religious Services: More than 4 times per year    Active Member of [employer] or Organizations: Yes    Attends Engineer, structural: More than 4 times per year    Marital Status: Married    Tobacco Counseling Counseling given: Not Answered   Clinical Intake:  Pre-visit preparation completed: Yes  Pain : No/denies pain     Nutritional Status: BMI of 19-24  Normal Nutritional Risks: None Diabetes: No  How often do you need to have someone help you when you read instructions, pamphlets, or other written materials from your doctor or pharmacy?: 1 - Never What is the last grade level you completed in school?: 8th grade  Interpreter Needed?: No      Activities of Daily Living    02/20/2023   11:15 AM 02/16/2023    9:46 AM  In your present state of health, do you have any difficulty performing the following activities:  Hearing? 0 0  Vision? 0 0  Difficulty concentrating or making decisions? 0 0  Walking or climbing stairs? 0 0  Dressing or bathing? 0 0  Doing errands, shopping?  0  Preparing Food and eating ? N N  Using the Toilet? N N  In the past six months, have you accidently leaked urine? N N  Do you have problems with loss of bowel control? N N  Managing your Medications? N N  Managing your Finances? N N  Housekeeping or managing your Housekeeping? N N    Patient Care Team: Etta Grandchild, MD as PCP - General (Internal Medicine)  Indicate any recent Medical Services you may  have received from other than [facility] providers in the past year (date may be approximate).     Assessment:   This is a routine wellness examination for Patrick Abbott.  Hearing/Vision screen Vision Screening - Comments:: Annual eye exam [facility]  Dietary issues and exercise activities discussed:     Goals Addressed   None   Depression Screen    02/20/2023   11:17 AM 02/20/2023   11:15 AM 12/09/2022    9:44 AM 12/05/2021  9:45 AM 11/28/2020    3:16 PM 06/26/2020    9:36 AM 05/07/2020   11:12 AM  PHQ 2/9 Scores  PHQ - 2 Score 0 0 0 3 0 0 0  PHQ- 9 Score   0 4       Fall Risk    02/20/2023   11:15 AM 02/16/2023    9:46 AM 12/09/2022    9:44 AM 12/05/2021    9:45 AM 12/04/2021   11:27 AM  Fall Risk   Falls in the past year? 0 0 0 1 1  Number falls in past yr: 0 0 0 0 0  Injury with Fall? 0 0 0 0 0  Risk for fall due to : History of fall(s)  No Fall Risks No Fall Risks   Follow up Falls evaluation completed  Falls prevention discussed Falls evaluation completed     MEDICARE RISK AT HOME: Medicare Risk at Home Any stairs in or around the home?: Yes If so, are there any without handrails?: Yes Home free of loose throw rugs in walkways, pet beds, electrical cords, etc?: Yes Adequate lighting in your home to reduce risk of falls?: Yes Life alert?: No Use of a cane, walker or w/c?: No Grab bars in the bathroom?: No Shower chair or bench in shower?: No Elevated toilet seat or a handicapped toilet?: No  TIMED UP AND GO:  Was the test performed?  No    Cognitive Function:        02/20/2023   11:17 AM 06/26/2020    9:36 AM 02/24/2019    3:33 PM  6CIT Screen  What Year? 0 points 0 points 0 points  What month? 0 points 0 points 0 points  What time? 0 points 0 points 0 points  Count back from 20 0 points 0 points 0 points  Months in reverse 0 points 0 points 0 points  Repeat phrase 0 points 0 points 0 points  Total Score 0 points 0 points 0 points     Immunizations Immunization History  Administered Date(s) Administered   Fluad Quad(high Dose 65+) 02/24/2019, 05/07/2020, 02/26/2022   Influenza, High Dose Seasonal PF 08/27/2018   Influenza,inj,Quad PF,6+ Mos 04/10/2015, 06/02/2016, 06/12/2017   Influenza-Unspecified 04/02/2021   Moderna Sars-Covid-2 Vaccination 08/06/2019, 09/03/2019   PFIZER(Purple Top)SARS-COV-2 Vaccination 08/06/2019   PNEUMOCOCCAL CONJUGATE-20 01/30/2023   Pneumococcal Conjugate-13 10/04/2015   Pneumococcal Polysaccharide-23 08/27/2018   Tdap 12/23/2012   Zoster Recombinant(Shingrix) 09/16/2014, 02/25/2019   Zoster, Live 09/16/2014    TDAP status: Due, Education has been provided regarding the importance of this vaccine. Advised may receive this vaccine at local pharmacy or Health Dept. Aware to provide a copy of the vaccination record if obtained from local pharmacy or Health Dept. Verbalized acceptance and understanding.  Flu Vaccine status: Due, Education has been provided regarding the importance of this vaccine. Advised may receive this vaccine at local pharmacy or Health Dept. Aware to provide a copy of the vaccination record if obtained from local pharmacy or Health Dept. Verbalized acceptance and understanding.  Pneumococcal vaccine status: Up to date  Covid-19 vaccine status: Completed vaccines  Qualifies for Shingles Vaccine? Yes   Zostavax completed Yes   Shingrix Completed?: Yes  Screening Tests Health Maintenance  Topic Date Due   COVID-19 Vaccine (4 - 2023-24 season) 02/21/2022   DTaP/Tdap/Td (2 - Td or Tdap) 12/24/2022   INFLUENZA VACCINE  09/21/2023 (Originally 01/22/2023)   Medicare Annual Wellness (AWV)  02/20/2024   Colonoscopy  03/24/2025  Pneumonia Vaccine 55+ Years old  Completed   Hepatitis C Screening  Completed   Zoster Vaccines- Shingrix  Completed   HPV VACCINES  Aged Out    Health Maintenance  Health Maintenance Due  Topic Date Due   COVID-19 Vaccine (4 - 2023-24  season) 02/21/2022   DTaP/Tdap/Td (2 - Td or Tdap) 12/24/2022    Colorectal cancer screening: Type of screening: Colonoscopy. Completed 03/24/2022. Repeat every 3 years  Lung Cancer Screening: (Low Dose CT Chest recommended if Age 72-80 years, 20 pack-year currently smoking OR have quit w/in 15years.) does not qualify.   Lung Cancer Screening Referral: n/a  Additional Screening:  Hepatitis C Screening: does qualify; Completed 10/04/2015  Vision Screening: Recommended annual ophthalmology exams for early detection of glaucoma and other disorders of the eye. Is the patient up to date with their annual eye exam?  Yes  Who is the provider or what is the name of the office in which the patient attends annual eye exams? [facility] If pt is not established with a provider, would they like to be referred to a provider to establish care? No .   Dental Screening: Recommended annual dental exams for proper oral hygiene  Diabetic Foot Exam: n/a  Community Resource Referral / Chronic Care Management: CRR required this visit?  No   CCM required this visit?  No     Plan:     I have personally reviewed and noted the following in the patient's chart:   Medical and social history Use of alcohol, tobacco or illicit drugs  Current medications and supplements including opioid prescriptions. Patient is not currently taking opioid prescriptions. Functional ability and status Nutritional status Physical activity Advanced directives List of other physicians Hospitalizations, surgeries, and ER visits in previous 12 months Vitals Screenings to include cognitive, depression, and falls Referrals and appointments  In addition, I have reviewed and discussed with patient certain preventive protocols, quality metrics, and best practice recommendations. A written personalized care plan for preventive services as well as general preventive health recommendations were provided to patient.      Tamela Oddi, CMA   02/20/2023  Non face to face 30 mins  After Visit Summary: (MyChart) Due to this being a telephonic visit, the after visit summary with patients personalized plan was offered to patient via MyChart   Nurse Notes:  Mr. Malta , Thank you for taking time to come for your Medicare Wellness Visit. I appreciate your ongoing commitment to your health goals. Please review the following plan we discussed and let me know if I can assist you in the future.   These are the goals we discussed:  Goals       Increase physical activity      Patient Stated (pt-stated)      I would like to stay healthy by drinking more water, eating more greens and staying busy.        This is a list of the screening recommended for you and due dates:  Health Maintenance  Topic Date Due   COVID-19 Vaccine (4 - 2023-24 season) 02/21/2022   DTaP/Tdap/Td vaccine (2 - Td or Tdap) 12/24/2022   Flu Shot  09/21/2023*   Medicare Annual Wellness Visit  02/20/2024   Colon Cancer Screening  03/24/2025   Pneumonia Vaccine  Completed   Hepatitis C Screening  Completed   Zoster (Shingles) Vaccine  Completed   HPV Vaccine  Aged Out  *Topic was postponed. The date shown is not the  original due date.

## 2023-06-11 ENCOUNTER — Encounter: Payer: Self-pay | Admitting: Internal Medicine

## 2023-06-11 ENCOUNTER — Ambulatory Visit: Payer: No Typology Code available for payment source | Admitting: Internal Medicine

## 2023-06-11 VITALS — BP 144/88 | HR 71 | Temp 98.1°F | Resp 16 | Ht 65.0 in | Wt 142.0 lb

## 2023-06-11 DIAGNOSIS — N1831 Chronic kidney disease, stage 3a: Secondary | ICD-10-CM | POA: Diagnosis not present

## 2023-06-11 DIAGNOSIS — E785 Hyperlipidemia, unspecified: Secondary | ICD-10-CM

## 2023-06-11 DIAGNOSIS — R0609 Other forms of dyspnea: Secondary | ICD-10-CM | POA: Insufficient documentation

## 2023-06-11 DIAGNOSIS — I10 Essential (primary) hypertension: Secondary | ICD-10-CM

## 2023-06-11 DIAGNOSIS — N401 Enlarged prostate with lower urinary tract symptoms: Secondary | ICD-10-CM | POA: Diagnosis not present

## 2023-06-11 DIAGNOSIS — R351 Nocturia: Secondary | ICD-10-CM | POA: Diagnosis not present

## 2023-06-11 LAB — CBC WITH DIFFERENTIAL/PLATELET
Basophils Absolute: 0 10*3/uL (ref 0.0–0.1)
Basophils Relative: 0.6 % (ref 0.0–3.0)
Eosinophils Absolute: 0.1 10*3/uL (ref 0.0–0.7)
Eosinophils Relative: 2 % (ref 0.0–5.0)
HCT: 44 % (ref 39.0–52.0)
Hemoglobin: 14.4 g/dL (ref 13.0–17.0)
Lymphocytes Relative: 30 % (ref 12.0–46.0)
Lymphs Abs: 1.9 10*3/uL (ref 0.7–4.0)
MCHC: 32.6 g/dL (ref 30.0–36.0)
MCV: 81.9 fL (ref 78.0–100.0)
Monocytes Absolute: 0.7 10*3/uL (ref 0.1–1.0)
Monocytes Relative: 10.5 % (ref 3.0–12.0)
Neutro Abs: 3.6 10*3/uL (ref 1.4–7.7)
Neutrophils Relative %: 56.9 % (ref 43.0–77.0)
Platelets: 249 10*3/uL (ref 150.0–400.0)
RBC: 5.37 Mil/uL (ref 4.22–5.81)
RDW: 13.7 % (ref 11.5–15.5)
WBC: 6.4 10*3/uL (ref 4.0–10.5)

## 2023-06-11 LAB — BASIC METABOLIC PANEL
BUN: 12 mg/dL (ref 6–23)
CO2: 30 meq/L (ref 19–32)
Calcium: 9.5 mg/dL (ref 8.4–10.5)
Chloride: 101 meq/L (ref 96–112)
Creatinine, Ser: 1.14 mg/dL (ref 0.40–1.50)
GFR: 64.05 mL/min (ref >60.00)
Glucose, Bld: 106 mg/dL — ABNORMAL HIGH (ref 70–99)
Potassium: 3.9 meq/L (ref 3.5–5.1)
Sodium: 138 meq/L (ref 135–145)

## 2023-06-11 LAB — TROPONIN I (HIGH SENSITIVITY): High Sens Troponin I: 5 ng/L (ref 2–17)

## 2023-06-11 LAB — BRAIN NATRIURETIC PEPTIDE: Pro B Natriuretic peptide (BNP): 48 pg/mL (ref 0.0–100.0)

## 2023-06-11 MED ORDER — TAMSULOSIN HCL 0.4 MG PO CAPS: 0.4000 mg | ORAL_CAPSULE | Freq: Every day | ORAL | 1 refills | Status: DC

## 2023-06-11 MED ORDER — PRAVASTATIN SODIUM 40 MG PO TABS: ORAL_TABLET | ORAL | 1 refills | Status: DC

## 2023-06-11 NOTE — Progress Notes (Signed)
Subjective:  Patient ID: Patrick Abbott, male    DOB: 01-05-1951  Age: 72 y.o. MRN: [id number]  CC: Hypertension and Hypothyroidism   HPI Dakota Tates presents for f/up -----  Discussed the use of AI scribe software for clinical note transcription with the patient, who gave verbal consent to proceed.  History of Present Illness   The patient, with a history of hypertension, reports experiencing intermittent episodes of dizziness and lightheadedness over the past week, which he associates with fluctuations in his blood pressure. He has been monitoring his blood pressure at home, noting readings as high as 150 systolic, but also periods of low readings. The patient reports feeling symptomatic, particularly experiencing dizziness, during these episodes of low blood pressure.  In addition to the blood pressure concerns, the patient also reports experiencing shortness of breath during exercise. He denies any associated chest pain. The shortness of breath reportedly resolves with rest. The patient also received a flu vaccine at a local pharmacy this year.       Outpatient Medications Prior to Visit  Medication Sig Dispense Refill   Omega-3 Fatty Acids (FISH OIL) 1200 MG CAPS Take 1 capsule by mouth daily at 6 (six) AM.     pravastatin (PRAVACHOL) 40 MG tablet TAKE 1 TABLET BY MOUTH EVERY DAY 90 tablet 1   tamsulosin (FLOMAX) 0.4 MG CAPS capsule Take 1 capsule (0.4 mg total) by mouth daily. 90 capsule 1   No facility-administered medications prior to visit.    ROS Review of Systems  Constitutional:  Negative for appetite change, chills, diaphoresis, fatigue and fever.  HENT: Negative.    Eyes: Negative.   Respiratory:  Positive for shortness of breath (DOE). Negative for cough, chest tightness and wheezing.   Cardiovascular:  Negative for chest pain, palpitations and leg swelling.  Gastrointestinal:  Negative for abdominal pain, constipation, diarrhea and nausea.  Endocrine: Negative.    Genitourinary: Negative.  Negative for difficulty urinating.  Musculoskeletal:  Negative for arthralgias, back pain, myalgias and neck pain.  Skin: Negative.   Neurological: Negative.  Negative for dizziness, weakness and light-headedness.  Hematological:  Negative for adenopathy. Does not bruise/bleed easily.  Psychiatric/Behavioral: Negative.      Objective:  BP (!) 144/88 (BP Location: Left Arm, Patient Position: Sitting, Cuff Size: Normal) Comment: BP (R) 140/86 (L) 144/88  Pulse 71   Temp 98.1 F (36.7 C) (Oral)   Resp 16   Ht 5\' 5"  (1.651 m)   Wt 142 lb (64.4 kg)   SpO2 97%   BMI 23.63 kg/m   BP Readings from Last 3 Encounters:  06/11/23 (!) 144/88  12/09/22 110/68  06/09/22 128/78    Wt Readings from Last 3 Encounters:  06/11/23 142 lb (64.4 kg)  02/20/23 135 lb (61.2 kg)  12/09/22 138 lb (62.6 kg)    Physical Exam Vitals reviewed.  Constitutional:      Appearance: Normal appearance.  HENT:     Nose: Nose normal.     Mouth/Throat:     Pharynx: Oropharynx is clear.  Eyes:     General: No scleral icterus.    Conjunctiva/sclera: Conjunctivae normal.  Cardiovascular:     Rate and Rhythm: Normal rate and regular rhythm.     Heart sounds: No murmur heard.    No friction rub. No gallop.  Pulmonary:     Effort: Pulmonary effort is normal.     Breath sounds: No stridor. No wheezing, rhonchi or rales.  Abdominal:     General: Abdomen  is flat.     Palpations: There is no mass.     Tenderness: There is no abdominal tenderness. There is no guarding.     Hernia: No hernia is present.  Musculoskeletal:        General: Normal range of motion.     Cervical back: Neck supple.     Right lower leg: No edema.     Left lower leg: No edema.  Lymphadenopathy:     Cervical: No cervical adenopathy.  Skin:    General: Skin is warm and dry.  Neurological:     General: No focal deficit present.     Mental Status: He is alert.  Psychiatric:        Mood and Affect: Mood  normal.        Behavior: Behavior normal.     Lab Results  Component Value Date   WBC 6.4 06/11/2023   HGB 14.4 06/11/2023   HCT 44.0 06/11/2023   PLT 249.0 06/11/2023   GLUCOSE 106 (H) 06/11/2023   CHOL 173 12/09/2022   TRIG 102.0 12/09/2022   HDL 44.60 12/09/2022   LDLDIRECT 102.0 12/05/2021   LDLCALC 108 (H) 12/09/2022   ALT 20 12/09/2022   AST 19 12/09/2022   NA 138 06/11/2023   K 3.9 06/11/2023   CL 101 06/11/2023   CREATININE 1.14 06/11/2023   BUN 12 06/11/2023   CO2 30 06/11/2023   TSH 0.92 12/09/2022   PSA 2.08 12/09/2022   HGBA1C 5.8 (H) 06/09/2022    Patient was never admitted.  Assessment & Plan:   DOE (dyspnea on exertion)- Will evaluate for CAD. -     CT CORONARY MORPH W/CTA COR W/SCORE W/CA W/CM &/OR WO/CM; Future -     Troponin I (High Sensitivity); Future -     Brain natriuretic peptide; Future -     CBC with Differential/Platelet; Future  Dyslipidemia, goal LDL below 130- LDL goal achieved. Doing well on the statin  -     Pravastatin Sodium; TAKE 1 TABLET BY MOUTH EVERY DAY  Dispense: 90 tablet; Refill: 1  Benign prostatic hyperplasia with nocturia -     Tamsulosin HCl; Take 1 capsule (0.4 mg total) by mouth daily.  Dispense: 90 capsule; Refill: 1  Essential hypertension- His BP is adequately well controlled. -     Basic metabolic panel; Future -     CBC with Differential/Platelet; Future  Stage 3a chronic kidney disease (HCC) - Will avoid nephrotoxic agents.     Follow-up: Return in about 6 months (around 12/10/2023).  Sanda Linger, MD

## 2023-06-11 NOTE — Patient Instructions (Signed)
Hypertension, Adult High blood pressure (hypertension) is when the force of blood pumping through the arteries is too strong. The arteries are the blood vessels that carry blood from the heart throughout the body. Hypertension forces the heart to work harder to pump blood and may cause arteries to become narrow or stiff. Untreated or uncontrolled hypertension can lead to a heart attack, heart failure, a stroke, kidney disease, and other problems. A blood pressure reading consists of a higher number over a lower number. Ideally, your blood pressure should be below 120/80. The first ("top") number is called the systolic pressure. It is a measure of the pressure in your arteries as your heart beats. The second ("bottom") number is called the diastolic pressure. It is a measure of the pressure in your arteries as the heart relaxes. What are the causes? The exact cause of this condition is not known. There are some conditions that result in high blood pressure. What increases the risk? Certain factors may make you more likely to develop high blood pressure. Some of these risk factors are under your control, including: Smoking. Not getting enough exercise or physical activity. Being overweight. Having too much fat, sugar, calories, or salt (sodium) in your diet. Drinking too much alcohol. Other risk factors include: Having a personal history of heart disease, diabetes, high cholesterol, or kidney disease. Stress. Having a family history of high blood pressure and high cholesterol. Having obstructive sleep apnea. Age. The risk increases with age. What are the signs or symptoms? High blood pressure may not cause symptoms. Very high blood pressure (hypertensive crisis) may cause: Headache. Fast or irregular heartbeats (palpitations). Shortness of breath. Nosebleed. Nausea and vomiting. Vision changes. Severe chest pain, dizziness, and seizures. How is this diagnosed? This condition is diagnosed by  measuring your blood pressure while you are seated, with your arm resting on a flat surface, your legs uncrossed, and your feet flat on the floor. The cuff of the blood pressure monitor will be placed directly against the skin of your upper arm at the level of your heart. Blood pressure should be measured at least twice using the same arm. Certain conditions can cause a difference in blood pressure between your right and left arms. If you have a high blood pressure reading during one visit or you have normal blood pressure with other risk factors, you may be asked to: Return on a different day to have your blood pressure checked again. Monitor your blood pressure at home for 1 week or longer. If you are diagnosed with hypertension, you may have other blood or imaging tests to help your health care provider understand your overall risk for other conditions. How is this treated? This condition is treated by making healthy lifestyle changes, such as eating healthy foods, exercising more, and reducing your alcohol intake. You may be referred for counseling on a healthy diet and physical activity. Your health care provider may prescribe medicine if lifestyle changes are not enough to get your blood pressure under control and if: Your systolic blood pressure is above 130. Your diastolic blood pressure is above 80. Your personal target blood pressure may vary depending on your medical conditions, your age, and other factors. Follow these instructions at home: Eating and drinking  Eat a diet that is high in fiber and potassium, and low in sodium, added sugar, and fat. An example of this eating plan is called the DASH diet. DASH stands for Dietary Approaches to Stop Hypertension. To eat this way: Eat   plenty of fresh fruits and vegetables. Try to fill one half of your plate at each meal with fruits and vegetables. Eat whole grains, such as whole-wheat pasta, brown rice, or whole-grain bread. Fill about one  fourth of your plate with whole grains. Eat or drink low-fat dairy products, such as skim milk or low-fat yogurt. Avoid fatty cuts of meat, processed or cured meats, and poultry with skin. Fill about one fourth of your plate with lean proteins, such as fish, chicken without skin, beans, eggs, or tofu. Avoid pre-made and processed foods. These tend to be higher in sodium, added sugar, and fat. Reduce your daily sodium intake. Many people with hypertension should eat less than 1,500 mg of sodium a day. Do not drink alcohol if: Your health care provider tells you not to drink. You are pregnant, may be pregnant, or are planning to become pregnant. If you drink alcohol: Limit how much you have to: 0-1 drink a day for women. 0-2 drinks a day for men. Know how much alcohol is in your drink. In the [place] one drink equals one 12 oz bottle of beer (355 mL), one 5 oz glass of wine (148 mL), or one 1 oz glass of hard liquor (44 mL). Lifestyle  Work with your health care provider to maintain a healthy body weight or to lose weight. Ask what an ideal weight is for you. Get at least 30 minutes of exercise that causes your heart to beat faster (aerobic exercise) most days of the week. Activities may include walking, swimming, or biking. Include exercise to strengthen your muscles (resistance exercise), such as Pilates or lifting weights, as part of your weekly exercise routine. Try to do these types of exercises for 30 minutes at least 3 days a week. Do not use any products that contain nicotine or tobacco. These products include cigarettes, chewing tobacco, and vaping devices, such as e-cigarettes. If you need help quitting, ask your health care provider. Monitor your blood pressure at home as told by your health care provider. Keep all follow-up visits. This is important. Medicines Take over-the-counter and prescription medicines only as told by your health care provider. Follow directions carefully. Blood  pressure medicines must be taken as prescribed. Do not skip doses of blood pressure medicine. Doing this puts you at risk for problems and can make the medicine less effective. Ask your health care provider about side effects or reactions to medicines that you should watch for. Contact a health care provider if you: Think you are having a reaction to a medicine you are taking. Have headaches that keep coming back (recurring). Feel dizzy. Have swelling in your ankles. Have trouble with your vision. Get help right away if you: Develop a severe headache or confusion. Have unusual weakness or numbness. Feel faint. Have severe pain in your chest or abdomen. Vomit repeatedly. Have trouble breathing. These symptoms may be an emergency. Get help right away. Call 911. Do not wait to see if the symptoms will go away. Do not drive yourself to the hospital. Summary Hypertension is when the force of blood pumping through your arteries is too strong. If this condition is not controlled, it may put you at risk for serious complications. Your personal target blood pressure may vary depending on your medical conditions, your age, and other factors. For most people, a normal blood pressure is less than 120/80. Hypertension is treated with lifestyle changes, medicines, or a combination of both. Lifestyle changes include losing weight, eating a healthy,   low-sodium diet, exercising more, and limiting alcohol. This information is not intended to replace advice given to you by your health care provider. Make sure you discuss any questions you have with your health care provider. Document Revised: 04/16/2021 Document Reviewed: 04/16/2021 Elsevier Patient Education  2024 Elsevier Inc.  

## 2023-06-23 DIAGNOSIS — N189 Chronic kidney disease, unspecified: Secondary | ICD-10-CM | POA: Diagnosis not present

## 2023-06-23 DIAGNOSIS — Z6823 Body mass index (BMI) 23.0-23.9, adult: Secondary | ICD-10-CM | POA: Diagnosis not present

## 2023-06-23 DIAGNOSIS — Z008 Encounter for other general examination: Secondary | ICD-10-CM | POA: Diagnosis not present

## 2023-07-03 DIAGNOSIS — I129 Hypertensive chronic kidney disease with stage 1 through stage 4 chronic kidney disease, or unspecified chronic kidney disease: Secondary | ICD-10-CM | POA: Diagnosis not present

## 2023-07-03 DIAGNOSIS — Z008 Encounter for other general examination: Secondary | ICD-10-CM | POA: Diagnosis not present

## 2023-07-08 ENCOUNTER — Encounter (HOSPITAL_COMMUNITY): Payer: Self-pay

## 2023-07-08 ENCOUNTER — Telehealth (HOSPITAL_COMMUNITY): Payer: Self-pay | Admitting: *Deleted

## 2023-07-08 MED ORDER — METOPROLOL TARTRATE 50 MG PO TABS: ORAL_TABLET | ORAL | 0 refills | Status: DC

## 2023-07-08 NOTE — Telephone Encounter (Signed)
 Patient returning call about his upcoming cardiac imaging study; pt verbalizes understanding of appt date/time, parking situation and where to check in, pre-test NPO status and medications ordered, and verified current allergies; name and call back number provided for further questions should they arise  Patrick Copping RN Navigator Cardiac Imaging [facility] [phone] office [phone] cell  Patient to take 50mg  metoprolol  tartrate two hours prior to his cardiac CT scan. He is aware to arrive at 11 AM.

## 2023-07-08 NOTE — Telephone Encounter (Signed)
 Attempted to call patient regarding upcoming cardiac CT appointment. Left message on voicemail with name and callback number  Larey Brick RN Navigator Cardiac Imaging [facility] [phone] Office [phone] Cell

## 2023-07-09 ENCOUNTER — Ambulatory Visit (HOSPITAL_COMMUNITY)
Admission: RE | Admit: 2023-07-09 | Discharge: 2023-07-09 | Disposition: A | Discharge status: Home / Self Care | Payer: No Typology Code available for payment source | Source: Ambulatory Visit | Attending: Internal Medicine | Admitting: Internal Medicine

## 2023-07-09 ENCOUNTER — Ambulatory Visit (HOSPITAL_BASED_OUTPATIENT_CLINIC_OR_DEPARTMENT_OTHER)
Admission: RE | Admit: 2023-07-09 | Discharge: 2023-07-09 | Disposition: A | Discharge status: Home / Self Care | Payer: No Typology Code available for payment source | Source: Ambulatory Visit | Attending: Cardiovascular Disease | Admitting: Cardiovascular Disease

## 2023-07-09 ENCOUNTER — Other Ambulatory Visit: Payer: Self-pay | Admitting: Cardiovascular Disease

## 2023-07-09 DIAGNOSIS — R0609 Other forms of dyspnea: Secondary | ICD-10-CM | POA: Insufficient documentation

## 2023-07-09 DIAGNOSIS — R931 Abnormal findings on diagnostic imaging of heart and coronary circulation: Secondary | ICD-10-CM | POA: Insufficient documentation

## 2023-07-09 DIAGNOSIS — Q2112 Patent foramen ovale: Secondary | ICD-10-CM | POA: Diagnosis not present

## 2023-07-09 DIAGNOSIS — I2510 Atherosclerotic heart disease of native coronary artery without angina pectoris: Secondary | ICD-10-CM | POA: Insufficient documentation

## 2023-07-09 MED ORDER — NITROGLYCERIN 0.4 MG SL SUBL
0.8000 mg | SUBLINGUAL_TABLET | Freq: Once | SUBLINGUAL | Status: AC
Start: 2023-07-09 — End: 2023-07-09
  Administered 2023-07-09: 0.8 mg via SUBLINGUAL

## 2023-07-09 MED ORDER — NITROGLYCERIN 0.4 MG SL SUBL
SUBLINGUAL_TABLET | SUBLINGUAL | Status: AC
  Filled 2023-07-09: qty 20

## 2023-07-09 MED ORDER — IOHEXOL 350 MG/ML SOLN
100.0000 mL | Freq: Once | INTRAVENOUS | Status: AC | PRN
  Administered 2023-07-09: 100 mL via INTRAVENOUS

## 2023-07-11 ENCOUNTER — Other Ambulatory Visit: Payer: Self-pay | Admitting: Internal Medicine

## 2023-07-11 DIAGNOSIS — I2510 Atherosclerotic heart disease of native coronary artery without angina pectoris: Secondary | ICD-10-CM | POA: Insufficient documentation

## 2023-07-11 MED ORDER — ASPIRIN 81 MG PO TBEC: 81.0000 mg | DELAYED_RELEASE_TABLET | Freq: Every day | ORAL | 1 refills | Status: AC

## 2023-07-11 NOTE — Progress Notes (Signed)
Ask him to take a baby aspirin each day in addition to his other meds

## 2023-07-13 ENCOUNTER — Ambulatory Visit (INDEPENDENT_AMBULATORY_CARE_PROVIDER_SITE_OTHER): Payer: No Typology Code available for payment source | Admitting: Family Medicine

## 2023-07-13 ENCOUNTER — Encounter: Payer: Self-pay | Admitting: Family Medicine

## 2023-07-13 VITALS — BP 122/82 | HR 73 | Temp 97.9°F | Ht 65.0 in | Wt 139.2 lb

## 2023-07-13 DIAGNOSIS — R300 Dysuria: Secondary | ICD-10-CM

## 2023-07-13 DIAGNOSIS — R8290 Unspecified abnormal findings in urine: Secondary | ICD-10-CM | POA: Diagnosis not present

## 2023-07-13 LAB — POC URINALSYSI DIPSTICK (AUTOMATED)
Glucose, UA: NEGATIVE
Ketones, UA: NEGATIVE
Protein, UA: NEGATIVE
Spec Grav, UA: 1.01 (ref 1.010–1.025)
Urobilinogen, UA: 2 U/dL — AB
pH, UA: 6 (ref 5.0–8.0)

## 2023-07-13 MED ORDER — CIPROFLOXACIN HCL 500 MG PO TABS: 500.0000 mg | ORAL_TABLET | Freq: Two times a day (BID) | ORAL | 0 refills | Status: AC

## 2023-07-13 NOTE — Patient Instructions (Signed)
I have sent in Cipro for you to take twice a day for 5 days.  Your urine was concerning for infection in the office today. We have sent it to the lab for culture and confirmation.   Follow-up with me for new or worsening symptoms.

## 2023-07-13 NOTE — Progress Notes (Signed)
Acute Office Visit  Subjective:     Patient ID: [facility], male    DOB: August 08, 1950, 73 y.o.   MRN: [id number]  Chief Complaint  Patient presents with   Dysuria    Notes of dysuria since this past Thursday night. Attempted using over the counter medication but this only helped slightly. Notes of no visible blood in urine or odor.    Dysuria    Urinary Tract Infection: Patient complains of burning with urination and right flank pain He has had symptoms for 4 days. Patient also complains of back pain. Patient denies congestion, cough, fever, headache, rhinitis, sorethroat, and stomach ache. Patient does not have a history of recurrent UTI.  Patient does not have a history of pyelonephritis.    Review of Systems  Genitourinary:  Positive for dysuria.   Per HPI      Objective:    BP 122/82   Pulse 73   Temp 97.9 F (36.6 C)   Ht 5\' 5"  (1.651 m)   Wt 139 lb 3.2 oz (63.1 kg)   SpO2 99%   BMI 23.16 kg/m    Physical Exam Vitals and nursing note reviewed.  Constitutional:      General: He is not in acute distress.    Appearance: Normal appearance. He is normal weight.  HENT:     Head: Normocephalic and atraumatic.  Eyes:     Extraocular Movements: Extraocular movements intact.  Cardiovascular:     Heart sounds: Normal heart sounds.  Pulmonary:     Effort: Pulmonary effort is normal.  Abdominal:     General: There is no distension.     Palpations: There is no mass.     Tenderness: There is no abdominal tenderness. There is no right CVA tenderness or left CVA tenderness.  Musculoskeletal:     Cervical back: Normal range of motion.  Skin:    General: Skin is warm and dry.  Neurological:     Mental Status: He is alert.     Results for orders placed or performed in visit on 07/13/23  POCT Urinalysis Dipstick (Automated)  Result Value Ref Range   Color, UA Orange    Clarity, UA     Glucose, UA Negative Negative   Bilirubin, UA 2+    Ketones, UA negative     Spec Grav, UA 1.010 1.010 - 1.025   Blood, UA +-    pH, UA 6.0 5.0 - 8.0   Protein, UA Negative Negative   Urobilinogen, UA 2.0 (A) 0.2 or 1.0 E.U./dL   Nitrite, UA +    Leukocytes, UA Moderate (2+) (A) Negative        Assessment & Plan:   1. Dysuria (Primary)  - POCT Urinalysis Dipstick (Automated) - Urine Culture; Future - ciprofloxacin (CIPRO) 500 MG tablet; Take 1 tablet (500 mg total) by mouth 2 (two) times daily for 5 days.  Dispense: 10 tablet; Refill: 0 - Urine Culture  2. Abnormal urinalysis  - Urine Culture; Future - ciprofloxacin (CIPRO) 500 MG tablet; Take 1 tablet (500 mg total) by mouth 2 (two) times daily for 5 days.  Dispense: 10 tablet; Refill: 0 - Urine Culture  Will treat with Cipro given flank pain with 4 day hx dysuria to cover for pyelonephritis    Meds ordered this encounter  Medications   ciprofloxacin (CIPRO) 500 MG tablet    Sig: Take 1 tablet (500 mg total) by mouth 2 (two) times daily for 5 days.  Dispense:  10 tablet    Refill:  0    Return if symptoms worsen or fail to improve.  Moshe Cipro, FNP

## 2023-07-14 LAB — URINE CULTURE: Result:: NO GROWTH

## 2023-07-15 ENCOUNTER — Encounter: Payer: Self-pay | Admitting: Family Medicine

## 2023-07-15 ENCOUNTER — Telehealth: Payer: Self-pay | Admitting: Internal Medicine

## 2023-07-15 DIAGNOSIS — R300 Dysuria: Secondary | ICD-10-CM

## 2023-07-15 DIAGNOSIS — Z136 Encounter for screening for cardiovascular disorders: Secondary | ICD-10-CM | POA: Diagnosis not present

## 2023-07-15 DIAGNOSIS — Z125 Encounter for screening for malignant neoplasm of prostate: Secondary | ICD-10-CM | POA: Diagnosis not present

## 2023-07-15 DIAGNOSIS — Z131 Encounter for screening for diabetes mellitus: Secondary | ICD-10-CM | POA: Diagnosis not present

## 2023-07-15 DIAGNOSIS — N401 Enlarged prostate with lower urinary tract symptoms: Secondary | ICD-10-CM | POA: Diagnosis not present

## 2023-07-15 DIAGNOSIS — Z0001 Encounter for general adult medical examination with abnormal findings: Secondary | ICD-10-CM | POA: Diagnosis not present

## 2023-07-15 DIAGNOSIS — I10 Essential (primary) hypertension: Secondary | ICD-10-CM | POA: Diagnosis not present

## 2023-07-15 DIAGNOSIS — R634 Abnormal weight loss: Secondary | ICD-10-CM | POA: Diagnosis not present

## 2023-07-15 DIAGNOSIS — Z1329 Encounter for screening for other suspected endocrine disorder: Secondary | ICD-10-CM | POA: Diagnosis not present

## 2023-07-15 NOTE — Telephone Encounter (Signed)
Addressing issue with patient via MyChart. Waiting on response back as to whether he would like [place] to reach out to a urologist. Ashley Royalty NP noted that she would still like him to continue with the cipro

## 2023-07-15 NOTE — Telephone Encounter (Signed)
Copied from CRM [id number]. Topic: Clinical - Prescription Issue >> Jul 15, 2023  8:52 AM Leavy Cella D wrote: Reason for CRM: Patient stated he was seen on 1/20 and was prescribed  ciprofloxacin (CIPRO) 500 MG tablet. Patient stated that medication is not helping and is unable to sleep . Patient is requesting a different medication . Please call patient to assist .

## 2023-07-25 ENCOUNTER — Encounter: Payer: Self-pay | Admitting: Internal Medicine

## 2023-07-28 DIAGNOSIS — I10 Essential (primary) hypertension: Secondary | ICD-10-CM | POA: Diagnosis not present

## 2023-07-28 DIAGNOSIS — E782 Mixed hyperlipidemia: Secondary | ICD-10-CM | POA: Diagnosis not present

## 2023-07-28 DIAGNOSIS — R7303 Prediabetes: Secondary | ICD-10-CM | POA: Diagnosis not present

## 2023-07-28 DIAGNOSIS — E871 Hypo-osmolality and hyponatremia: Secondary | ICD-10-CM | POA: Diagnosis not present

## 2023-07-28 DIAGNOSIS — N401 Enlarged prostate with lower urinary tract symptoms: Secondary | ICD-10-CM | POA: Diagnosis not present

## 2023-08-23 IMAGING — DX DG LUMBAR SPINE COMPLETE 4+V
5 series · 5 of 5 positions shown · non-contrast
Comparison: None Available.

CLINICAL DATA: Low back pain for 3 weeks.

EXAM:
LUMBAR SPINE - COMPLETE 4+ VIEW

[l-spine ap]
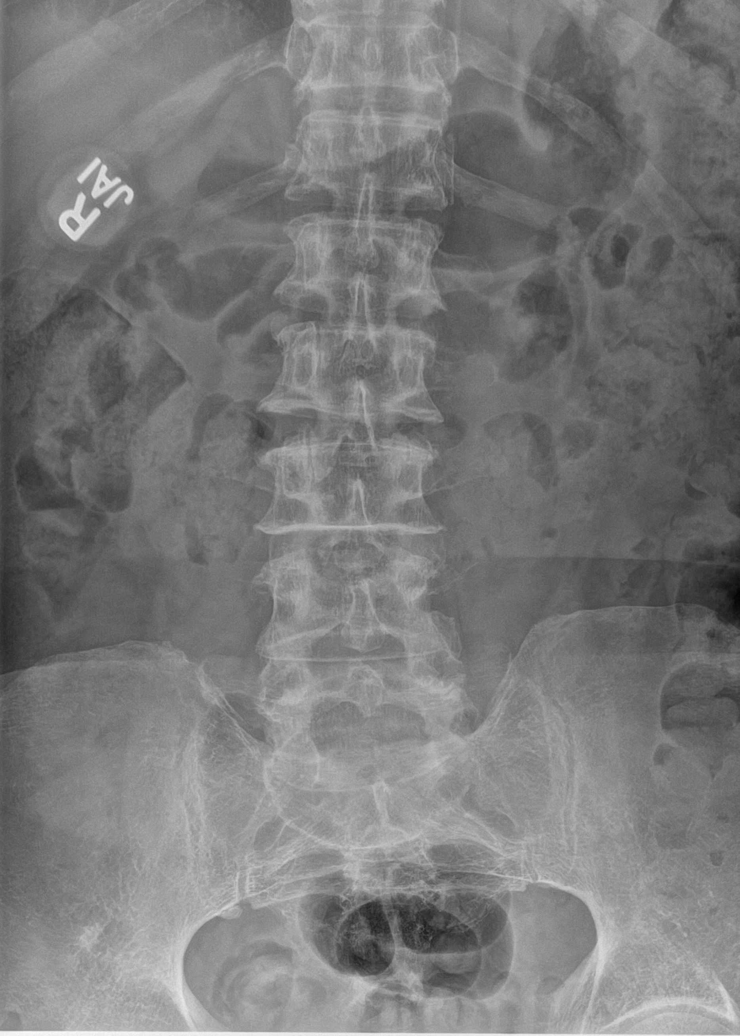

[l-spine obl (1 of 2)]
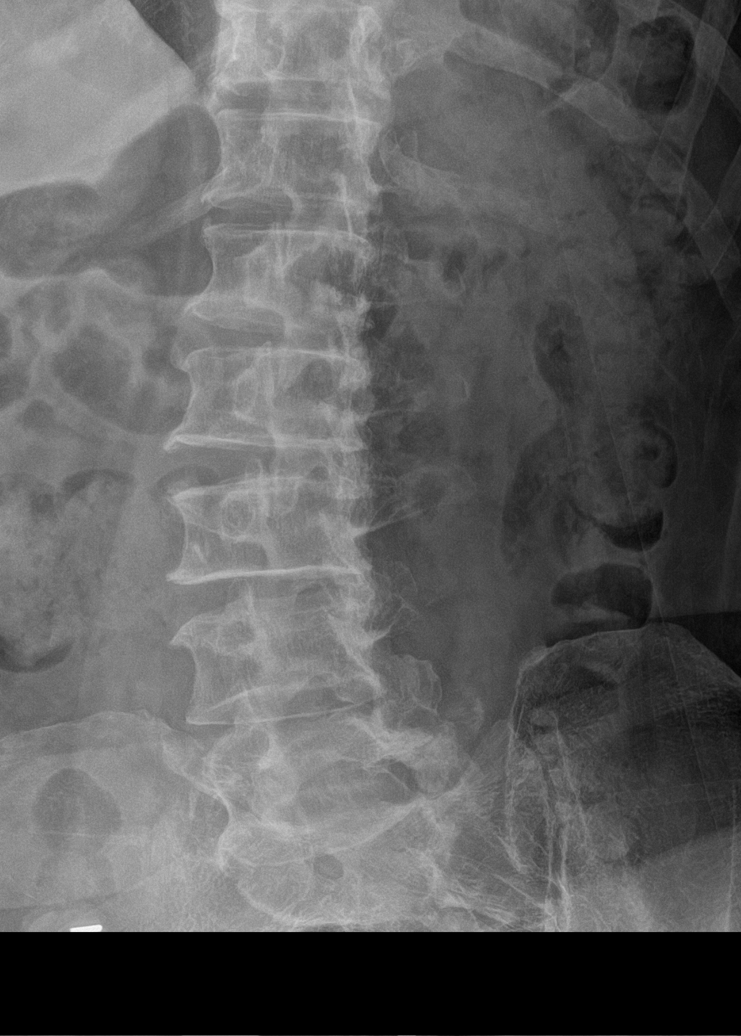

[l-spine obl (2 of 2)]
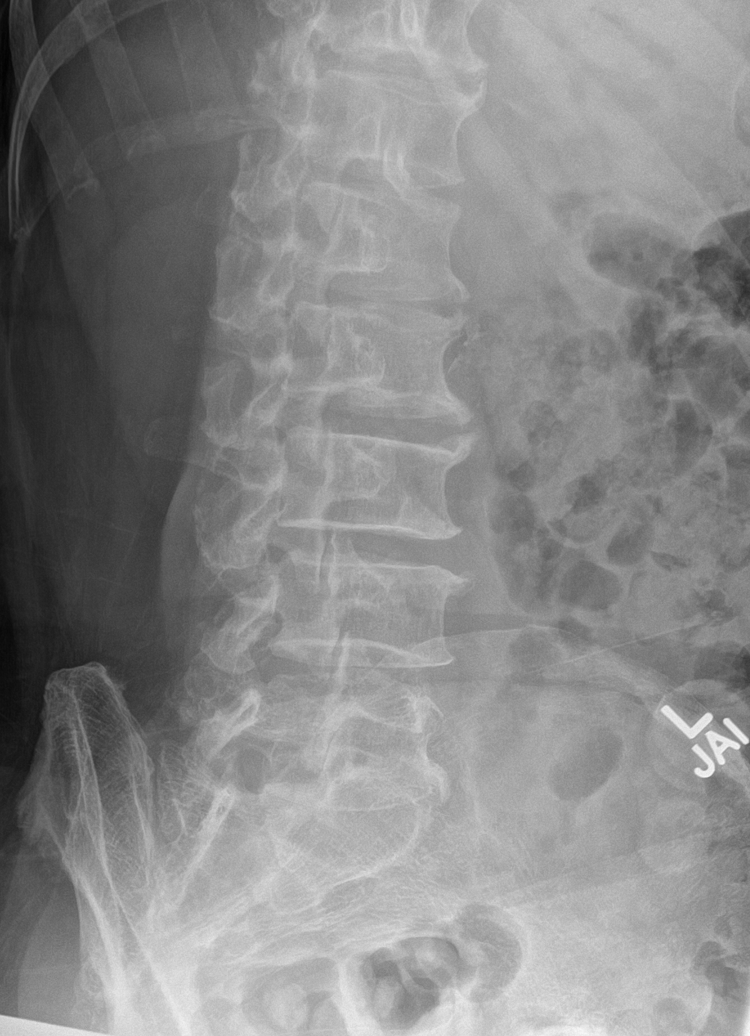

[l-spine lateral]
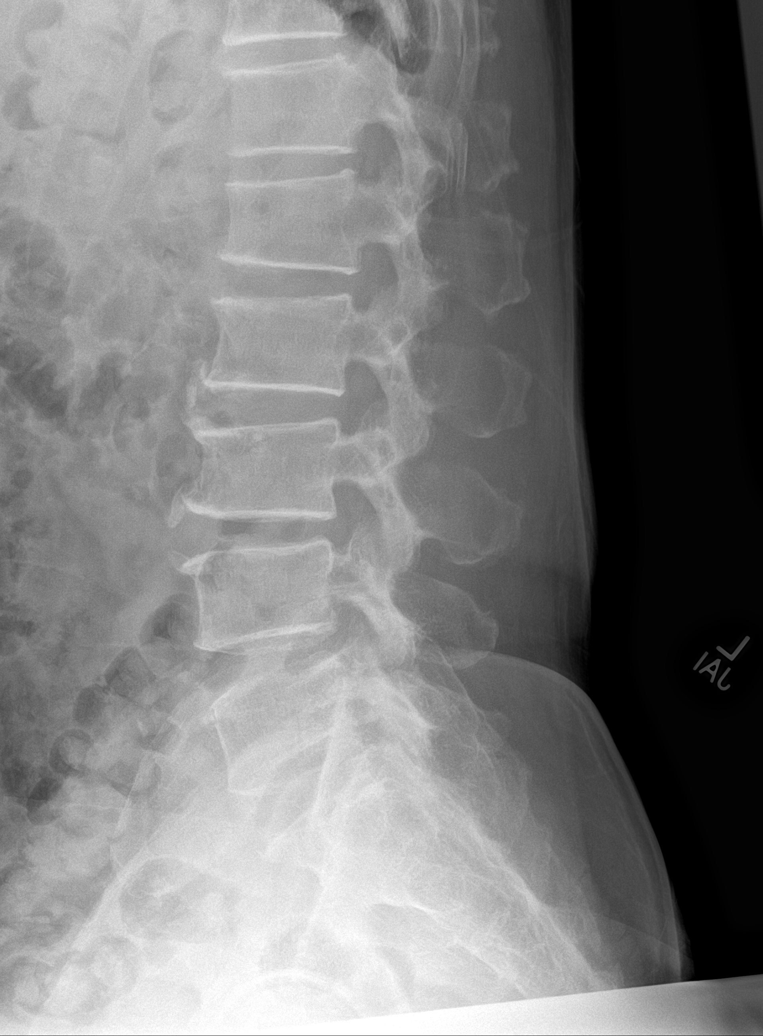

[l-spine spot]
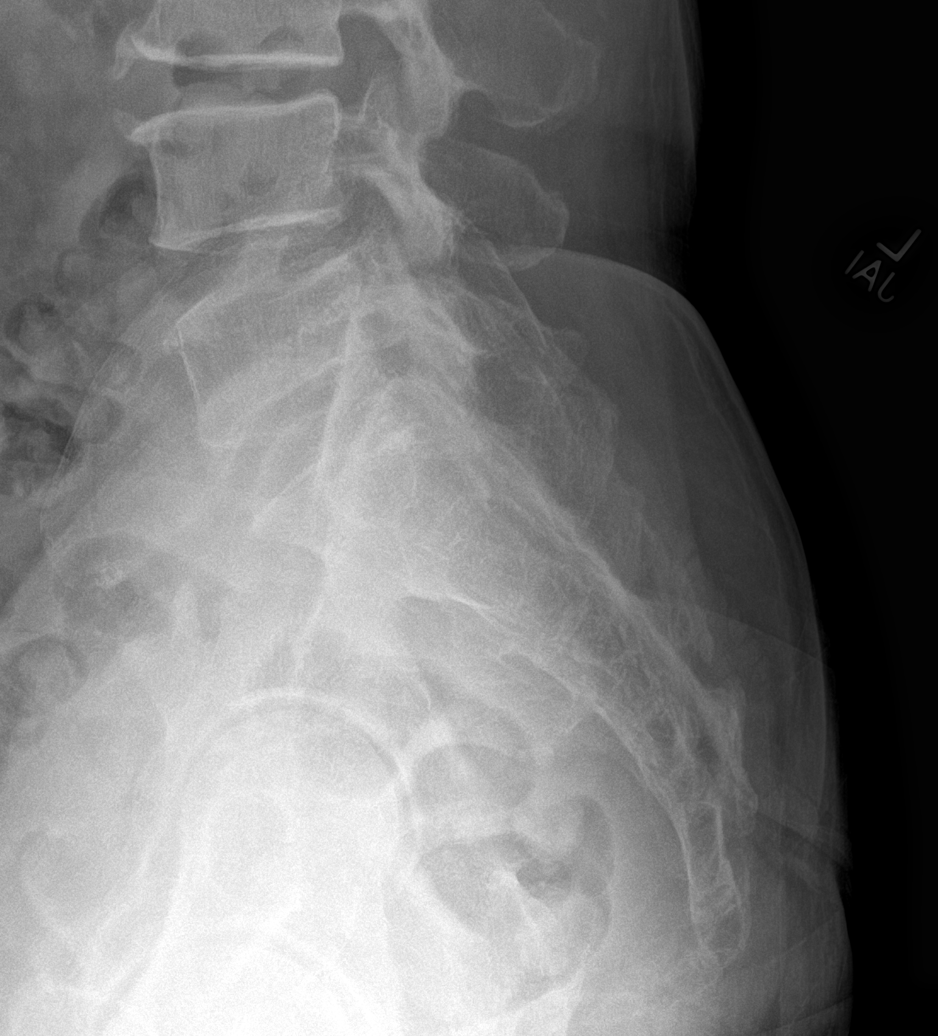

[5 of 5 positions shown; findings below may reference images not displayed]

FINDINGS: There is no evidence of lumbar spine fracture. Scoliosis of spine is
noted. Mild degenerative joint changes with anterior osteophytosis
are identified at L2, L3 and L4. Mild facet joint degenerative joint
changes are identified at L4-5 and L5-S1. Mild chronic compression
deformity of L1 is noted.
IMPRESSION: Mild degenerative joint changes of lumbar spine.

## 2023-09-08 DIAGNOSIS — H52223 Regular astigmatism, bilateral: Secondary | ICD-10-CM | POA: Diagnosis not present

## 2023-09-08 DIAGNOSIS — H5203 Hypermetropia, bilateral: Secondary | ICD-10-CM | POA: Diagnosis not present

## 2023-09-08 DIAGNOSIS — H524 Presbyopia: Secondary | ICD-10-CM | POA: Diagnosis not present

## 2023-10-01 DIAGNOSIS — R7303 Prediabetes: Secondary | ICD-10-CM | POA: Diagnosis not present

## 2023-10-01 DIAGNOSIS — Z008 Encounter for other general examination: Secondary | ICD-10-CM | POA: Diagnosis not present

## 2023-11-02 ENCOUNTER — Ambulatory Visit (INDEPENDENT_AMBULATORY_CARE_PROVIDER_SITE_OTHER)

## 2023-11-02 VITALS — BP 122/62 | HR 63 | Ht 64.0 in | Wt 138.4 lb

## 2023-11-02 DIAGNOSIS — Z0000 Encounter for general adult medical examination without abnormal findings: Secondary | ICD-10-CM | POA: Diagnosis not present

## 2023-11-02 NOTE — Progress Notes (Signed)
 Subjective:   Patrick [facility] is a 73 y.o. who presents for a Medicare Wellness preventive visit.  As a reminder, Annual Wellness Visits don't include a physical exam, and some assessments may be limited, especially if this visit is performed virtually. We may recommend an in-person visit if needed.  Visit Complete: In person  Persons Participating in Visit: Patient.  AWV Questionnaire: Yes: Patient Medicare AWV questionnaire was completed by the patient on 10/29/2023; I have confirmed that all information answered by patient is correct and no changes since this date.  Cardiac Risk Factors include: advanced age (>12men, >62 women);hypertension;dyslipidemia;male gender     Objective:     Today's Vitals   11/02/23 1420  BP: 122/62  Pulse: 63  SpO2: 97%  Weight: 138 lb 6.4 oz (62.8 kg)  Height: 5\' 4"  (1.626 m)   Body mass index is 23.76 kg/m.     11/02/2023    2:20 PM 02/20/2023   11:16 AM 06/26/2020    9:40 AM  Advanced Directives  Does Patient Have a Medical Advance Directive? No No No  Would patient like information on creating a medical advance directive? Yes (MAU/Ambulatory/Procedural Areas - Information given) No - Patient declined No - Patient declined    Current Medications (verified) Outpatient Encounter Medications as of 11/02/2023  Medication Sig   aspirin  EC 81 MG tablet Take 1 tablet (81 mg total) by mouth daily.   Omega-3 Fatty Acids (FISH OIL) 1200 MG CAPS Take 1 capsule by mouth daily at 6 (six) AM.   pravastatin  (PRAVACHOL ) 40 MG tablet TAKE 1 TABLET BY MOUTH EVERY DAY   tamsulosin  (FLOMAX ) 0.4 MG CAPS capsule Take 1 capsule (0.4 mg total) by mouth daily.   metoprolol  tartrate (LOPRESSOR ) 50 MG tablet Take tablet (50mg ) by mouth TWO hours prior to your cardiac CT scan.   No facility-administered encounter medications on file as of 11/02/2023.    Allergies (verified) Patient has no known allergies.   History: Past Medical History:  Diagnosis Date    Depression    Hyperlipidemia    on meds   Hypertension    on meds   Past Surgical History:  Procedure Laterality Date   COLONOSCOPY  2020   KN-MAC-suprep(exc)-TA's   HERNIA REPAIR  1992   Left   Family History  Problem Relation Age of Onset   Colon cancer Neg Hx    Esophageal cancer Neg Hx    Rectal cancer Neg Hx    Stomach cancer Neg Hx    Colon polyps Neg Hx    Social History   Socioeconomic History   Marital status: Married    Spouse name: Not on file   Number of children: Not on file   Years of education: Not on file   Highest education level: 3rd grade  Occupational History   Not on file  Tobacco Use   Smoking status: Never    Passive exposure: Never   Smokeless tobacco: Never  Vaping Use   Vaping status: Never Used  Substance and Sexual Activity   Alcohol use: Not Currently    Alcohol/week: 0.0 - 2.0 standard drinks of alcohol   Drug use: No   Sexual activity: Yes  Other Topics Concern   Not on file  Social History Narrative   Marital status: married x 32 years; moved from [place] in 1982.      Children: 3 children; 1 grandchild.      Employment:  Optician, dispensing.      Tobacco: none  Alcohol:  Once per month      Exercise:  Weights, walking.   Social Drivers of Corporate investment banker Strain: Low Risk  (11/02/2023)   Overall Financial Resource Strain (CARDIA)    Difficulty of Paying Living Expenses: Not very hard  Food Insecurity: No Food Insecurity (11/02/2023)   Hunger Vital Sign    Worried About Running Out of Food in the Last Year: Never true    Ran Out of Food in the Last Year: Never true  Transportation Needs: No Transportation Needs (11/02/2023)   PRAPARE - Administrator, Civil Service (Medical): No    Lack of Transportation (Non-Medical): No  Physical Activity: Sufficiently Active (11/02/2023)   Exercise Vital Sign    Days of Exercise per Week: 5 days    Minutes of Exercise per Session: 30 min  Stress: No Stress  Concern Present (11/02/2023)   [employer] of Occupational Health - Occupational Stress Questionnaire    Feeling of Stress : Only a little  Social Connections: Moderately Integrated (11/02/2023)   Social Connection and Isolation Panel [NHANES]    Frequency of Communication with Friends and Family: Once a week    Frequency of Social Gatherings with Friends and Family: Once a week    Attends Religious Services: 1 to 4 times per year    Active Member of [employer] or Organizations: Yes    Attends Engineer, structural: More than 4 times per year    Marital Status: Married    Tobacco Counseling Counseling given: No    Clinical Intake:  Pre-visit preparation completed: Yes  Pain : No/denies pain     BMI - recorded: 23.76 Nutritional Risks: None Diabetes: No  Lab Results  Component Value Date   HGBA1C 5.8 (H) 06/09/2022   HGBA1C 5.1 12/23/2012     How often do you need to have someone help you when you read instructions, pamphlets, or other written materials from your doctor or pharmacy?: 1 - Never  Interpreter Needed?: No  Information entered by :: Kandy Orris, CMA   Activities of Daily Living     11/02/2023    2:22 PM 10/29/2023    9:20 AM  In your present state of health, do you have any difficulty performing the following activities:  Hearing? 0 0  Vision? 0 0  Difficulty concentrating or making decisions? 0 0  Walking or climbing stairs? 0 0  Dressing or bathing? 0 0  Doing errands, shopping? 0 0  Preparing Food and eating ? N N  Using the Toilet? N N  In the past six months, have you accidently leaked urine? N N  Do you have problems with loss of bowel control? N N  Managing your Medications? N N  Managing your Finances? N N  Housekeeping or managing your Housekeeping? N N    Patient Care Team: Arcadio Knuckles, MD as PCP - General (Internal Medicine)  Indicate any recent Medical Services you may have received from other than [facility] providers in  the past year (date may be approximate).     Assessment:    This is a routine wellness examination for Male.  Hearing/Vision screen Hearing Screening - Comments:: Denies hearing difficulties   Vision Screening - Comments:: Wears rx glasses - up to date with routine eye exams with [facility]    Goals Addressed               This Visit's Progress     Patient  Stated (pt-stated)        Patient stated he plans to manage diet and staying active (exercising) and also manage bp.       Depression Screen     11/02/2023    2:26 PM 02/20/2023   11:17 AM 02/20/2023   11:15 AM 12/09/2022    9:44 AM 12/05/2021    9:45 AM 11/28/2020    3:16 PM 06/26/2020    9:36 AM  PHQ 2/9 Scores  PHQ - 2 Score 0 0 0 0 3 0 0  PHQ- 9 Score 0   0 4      Fall Risk     11/02/2023    2:23 PM 10/29/2023    9:20 AM 02/20/2023   11:15 AM 02/16/2023    9:46 AM 12/09/2022    9:44 AM  Fall Risk   Falls in the past year? 0 0 0 0 0  Number falls in past yr: 0 0 0 0 0  Injury with Fall? 0 0 0 0 0  Risk for fall due to : No Fall Risks  History of fall(s)  No Fall Risks  Follow up Falls prevention discussed;Falls evaluation completed  Falls evaluation completed  Falls prevention discussed    MEDICARE RISK AT HOME:  Medicare Risk at Home Any stairs in or around the home?: Yes If so, are there any without handrails?: No Home free of loose throw rugs in walkways, pet beds, electrical cords, etc?: No Adequate lighting in your home to reduce risk of falls?: Yes Life alert?: Yes Use of a cane, walker or w/c?: No Grab bars in the bathroom?: Yes Shower chair or bench in shower?: Yes Elevated toilet seat or a handicapped toilet?: No  TIMED UP AND GO:  Was the test performed?  No  Cognitive Function: 6CIT completed        11/02/2023    2:26 PM 02/20/2023   11:17 AM 06/26/2020    9:36 AM 02/24/2019    3:33 PM  6CIT Screen  What Year? 0 points 0 points 0 points 0 points  What month? 0 points 0 points 0 points 0  points  What time? 0 points 0 points 0 points 0 points  Count back from 20 0 points 0 points 0 points 0 points  Months in reverse 0 points 0 points 0 points 0 points  Repeat phrase 0 points 0 points 0 points 0 points  Total Score 0 points 0 points 0 points 0 points    Immunizations Immunization History  Administered Date(s) Administered   Fluad Quad(high Dose 65+) 02/24/2019, 05/07/2020, 02/26/2022   Influenza, High Dose Seasonal PF 08/27/2018   Influenza,inj,Quad PF,6+ Mos 04/10/2015, 06/02/2016, 06/12/2017   Influenza-Unspecified 04/02/2021   Moderna Sars-Covid-2 Vaccination 08/06/2019, 09/03/2019   PFIZER(Purple Top)SARS-COV-2 Vaccination 08/06/2019   PNEUMOCOCCAL CONJUGATE-20 01/30/2023   Pneumococcal Conjugate-13 10/04/2015   Pneumococcal Polysaccharide-23 08/27/2018   Rsv, Bivalent, Protein Subunit Rsvpref,pf (Abrysvo) 04/01/2023   Tdap 12/23/2012   Zoster Recombinant(Shingrix ) 09/16/2014, 02/25/2019   Zoster, Live 09/16/2014    Screening Tests Health Maintenance  Topic Date Due   DTaP/Tdap/Td (2 - Td or Tdap) 12/24/2022   COVID-19 Vaccine (4 - 2024-25 season) 02/22/2023   INFLUENZA VACCINE  01/22/2024   Medicare Annual Wellness (AWV)  11/01/2024   Colonoscopy  03/24/2025   Pneumonia Vaccine 33+ Years old  Completed   Hepatitis C Screening  Completed   Zoster Vaccines- Shingrix   Completed   HPV VACCINES  Aged Out   Meningococcal B Vaccine  Aged Out    Health Maintenance  Health Maintenance Due  Topic Date Due   DTaP/Tdap/Td (2 - Td or Tdap) 12/24/2022   COVID-19 Vaccine (4 - 2024-25 season) 02/22/2023   Health Maintenance Items Addressed: 11/02/2023   Additional Screening:  Vision Screening: Recommended annual ophthalmology exams for early detection of glaucoma and other disorders of the eye.  Dental Screening: Recommended annual dental exams for proper oral hygiene  Community Resource Referral / Chronic Care Management: CRR required this visit?  No    CCM required this visit?  No   Plan:    I have personally reviewed and noted the following in the patient's chart:   Medical and social history Use of alcohol, tobacco or illicit drugs  Current medications and supplements including opioid prescriptions. Patient is not currently taking opioid prescriptions. Functional ability and status Nutritional status Physical activity Advanced directives List of other physicians Hospitalizations, surgeries, and ER visits in previous 12 months Vitals Screenings to include cognitive, depression, and falls Referrals and appointments  In addition, I have reviewed and discussed with patient certain preventive protocols, quality metrics, and best practice recommendations. A written personalized care plan for preventive services as well as general preventive health recommendations were provided to patient.   Patria Bookbinder, CMA   11/02/2023   After Visit Summary: (MyChart) Due to this being a telephonic visit, the after visit summary with patients personalized plan was offered to patient via MyChart   Notes: Nothing significant to report at this time.

## 2023-11-02 NOTE — Patient Instructions (Signed)
 Patrick Abbott , Thank you for taking time out of your busy schedule to complete your Annual Wellness Visit with me. I enjoyed our conversation and look forward to speaking with you again next year. I, as well as your care team,  appreciate your ongoing commitment to your health goals. Please review the following plan we discussed and let me know if I can assist you in the future. Your Game plan/ To Do List   Follow up Visits: Next Medicare AWV with our clinical staff: 11/02/2024   Have you seen your provider in the last 6 months (3 months if uncontrolled diabetes)? Yes Next Office Visit with your provider: 12/10/2023  Clinician Recommendations:  Aim for 30 minutes of exercise or brisk walking, 6-8 glasses of water, and 5 servings of fruits and vegetables each day. Educated and advised on getting the Tdap (Tetenus) and COVID vaccines in 2025.      This is a list of the screening recommended for you and due dates:  Health Maintenance  Topic Date Due   DTaP/Tdap/Td vaccine (2 - Td or Tdap) 12/24/2022   COVID-19 Vaccine (4 - 2024-25 season) 02/22/2023   Flu Shot  01/22/2024   Medicare Annual Wellness Visit  11/01/2024   Colon Cancer Screening  03/24/2025   Pneumonia Vaccine  Completed   Hepatitis C Screening  Completed   Zoster (Shingles) Vaccine  Completed   HPV Vaccine  Aged Out   Meningitis B Vaccine  Aged Out    Advanced directives: (Provided) Advance directive discussed with you today. I have provided a copy for you to complete at home and have notarized. Once this is complete, please bring a copy in to our office so we can scan it into your chart.  Advance Care Planning is important because it:  [x]  Makes sure you receive the medical care that is consistent with your values, goals, and preferences  [x]  It provides guidance to your family and loved ones and reduces their decisional burden about whether or not they are making the right decisions based on your wishes.  Follow the link  provided in your after visit summary or read over the paperwork we have mailed to you to help you started getting your Advance Directives in place. If you need assistance in completing these, please reach out to us  so that we can help you!

## 2023-12-10 ENCOUNTER — Ambulatory Visit (INDEPENDENT_AMBULATORY_CARE_PROVIDER_SITE_OTHER): Payer: No Typology Code available for payment source | Admitting: Internal Medicine

## 2023-12-10 ENCOUNTER — Ambulatory Visit: Payer: Self-pay | Admitting: Internal Medicine

## 2023-12-10 VITALS — BP 132/78 | HR 58 | Temp 97.7°F | Resp 16 | Ht 64.0 in | Wt 132.6 lb

## 2023-12-10 DIAGNOSIS — N401 Enlarged prostate with lower urinary tract symptoms: Secondary | ICD-10-CM | POA: Diagnosis not present

## 2023-12-10 DIAGNOSIS — R001 Bradycardia, unspecified: Secondary | ICD-10-CM | POA: Diagnosis not present

## 2023-12-10 DIAGNOSIS — E785 Hyperlipidemia, unspecified: Secondary | ICD-10-CM

## 2023-12-10 DIAGNOSIS — I10 Essential (primary) hypertension: Secondary | ICD-10-CM

## 2023-12-10 DIAGNOSIS — R351 Nocturia: Secondary | ICD-10-CM

## 2023-12-10 LAB — BASIC METABOLIC PANEL WITH GFR
BUN: 11 mg/dL (ref 6–23)
CO2: 30 meq/L (ref 19–32)
Calcium: 9.1 mg/dL (ref 8.4–10.5)
Chloride: 104 meq/L (ref 96–112)
Creatinine, Ser: 1.03 mg/dL (ref 0.40–1.50)
GFR: 72.09 mL/min (ref >60.00)
Glucose, Bld: 92 mg/dL (ref 70–99)
Potassium: 3.8 meq/L (ref 3.5–5.1)
Sodium: 139 meq/L (ref 135–145)

## 2023-12-10 LAB — CBC WITH DIFFERENTIAL/PLATELET
Basophils Absolute: 0 10*3/uL (ref 0.0–0.1)
Basophils Relative: 0.8 % (ref 0.0–3.0)
Eosinophils Absolute: 0.1 10*3/uL (ref 0.0–0.7)
Eosinophils Relative: 1.6 % (ref 0.0–5.0)
HCT: 40.4 % (ref 39.0–52.0)
Hemoglobin: 13.4 g/dL (ref 13.0–17.0)
Lymphocytes Relative: 39.6 % (ref 12.0–46.0)
Lymphs Abs: 2 10*3/uL (ref 0.7–4.0)
MCHC: 33.1 g/dL (ref 30.0–36.0)
MCV: 80.9 fl (ref 78.0–100.0)
Monocytes Absolute: 0.3 10*3/uL (ref 0.1–1.0)
Monocytes Relative: 5.9 % (ref 3.0–12.0)
Neutro Abs: 2.6 10*3/uL (ref 1.4–7.7)
Neutrophils Relative %: 52.1 % (ref 43.0–77.0)
Platelets: 201 10*3/uL (ref 150.0–400.0)
RBC: 4.99 Mil/uL (ref 4.22–5.81)
RDW: 13.6 % (ref 11.5–15.5)
WBC: 5.1 10*3/uL (ref 4.0–10.5)

## 2023-12-10 LAB — URINALYSIS, ROUTINE W REFLEX MICROSCOPIC
Bilirubin Urine: NEGATIVE
Hgb urine dipstick: NEGATIVE
Ketones, ur: NEGATIVE
Leukocytes,Ua: NEGATIVE
Nitrite: NEGATIVE
RBC / HPF: NONE SEEN (ref 0–?)
Specific Gravity, Urine: 1.01 (ref 1.000–1.030)
Total Protein, Urine: NEGATIVE
Urine Glucose: NEGATIVE
Urobilinogen, UA: 0.2 (ref 0.0–1.0)
WBC, UA: NONE SEEN (ref 0–?)
pH: 7 (ref 5.0–8.0)

## 2023-12-10 LAB — LIPID PANEL
Cholesterol: 150 mg/dL (ref 0–200)
HDL: 41.2 mg/dL (ref >39.00)
LDL Cholesterol: 85 mg/dL (ref 0–99)
NonHDL: 109.01
Total CHOL/HDL Ratio: 4
Triglycerides: 118 mg/dL (ref 0.0–149.0)
VLDL: 23.6 mg/dL (ref 0.0–40.0)

## 2023-12-10 LAB — PSA: PSA: 0.89 ng/mL (ref 0.10–4.00)

## 2023-12-10 LAB — TSH: TSH: 1.05 u[IU]/mL (ref 0.35–5.50)

## 2023-12-10 NOTE — Patient Instructions (Signed)
 Bradycardia, Adult Bradycardia is a slower-than-normal heartbeat. A normal resting heart rate for an adult ranges from 60 to 100 beats per minute. With bradycardia, the resting heart rate is less than 60 beats per minute. Bradycardia can prevent enough oxygen from reaching certain areas of your body when you are active. It can be serious if it keeps enough oxygen from reaching your brain and other parts of your body. Bradycardia is not a problem for everyone. For some healthy adults, a slow resting heart rate is normal. What are the causes? This condition may be caused by: A problem with the heart, including: A problem with the heart's electrical system, such as a heart block. With a heart block, electrical signals between the chambers of the heart are partially or completely blocked, so they are not able to work as they should. A problem with the heart's natural pacemaker (sinus node). Heart disease. A heart attack. Heart damage. Lyme disease. A heart infection. A heart condition that is present at birth (congenital heart defect). Certain medicines that treat heart conditions. Certain conditions, such as hypothyroidism and obstructive sleep apnea. Problems with the balance of chemicals and other substances, like potassium, in the blood. Trauma. Radiation therapy. What increases the risk? You are more likely to develop this condition if you: Are age 51 or older. Have high blood pressure (hypertension), high cholesterol (hyperlipidemia), or diabetes. Drink heavily, use tobacco or nicotine products, or use drugs. What are the signs or symptoms? Symptoms of this condition include: Light-headedness. Feeling faint or fainting. Fatigue and weakness. Trouble with activity or exercise. Shortness of breath. Chest pain (angina). Drowsiness. Confusion. Dizziness. How is this diagnosed? This condition may be diagnosed based on: Your symptoms. Your medical history. A physical exam. During  the exam, your health care provider will listen to your heartbeat and check your pulse. To confirm the diagnosis, your health care provider may order tests, such as: Blood tests. An electrocardiogram (ECG). This test records the heart's electrical activity. The test can show how fast your heart is beating and whether the heartbeat is steady. A test in which you wear a portable device (event recorder or Holter monitor) to record your heart's electrical activity while you go about your day. An exercise test. How is this treated? Treatment for this condition depends on the cause of the condition and how severe your symptoms are. Treatment may involve: Treatment of the underlying condition. Changing your medicines or how much medicine you take. Having a small, battery-operated device called a pacemaker implanted under the skin. When bradycardia occurs, this device can be used to increase your heart rate and help your heart beat in a regular rhythm. Follow these instructions at home: Lifestyle Manage any health conditions that contribute to bradycardia as told by your health care provider. Follow a heart-healthy diet. A nutrition specialist (dietitian) can help educate you about healthy food options and changes. Follow an exercise program that is approved by your health care provider. Maintain a healthy weight. Try to reduce or manage your stress, such as with yoga or meditation. If you need help reducing stress, ask your health care provider. Do not use any products that contain nicotine or tobacco. These products include cigarettes, chewing tobacco, and vaping devices, such as e-cigarettes. If you need help quitting, ask your health care provider. Do not use illegal drugs. Alcohol use If you drink alcohol: Limit how much you have to: 0-1 drink a day for women who are not pregnant. 0-2 drinks a day  for men. Know how much alcohol is in a drink. In the [place] one drink equals one 12 oz bottle of  beer (355 mL), one 5 oz glass of wine (148 mL), or one 1 oz glass of hard liquor (44 mL). General instructions Take over-the-counter and prescription medicines only as told by your health care provider. Keep all follow-up visits. This is important. How is this prevented? In some cases, bradycardia may be prevented by: Treating underlying medical problems. Stopping behaviors or medicines that can trigger the condition. Contact a health care provider if: You feel light-headed or dizzy. You almost faint. You feel weak or are easily fatigued during physical activity. You experience confusion or have memory problems. Get help right away if: You faint. You have chest pains or an irregular heartbeat (palpitations). You have trouble breathing. These symptoms may represent a serious problem that is an emergency. Do not wait to see if the symptoms will go away. Get medical help right away. Call your local emergency services (911 in the [place] Do not drive yourself to the hospital. Summary Bradycardia is a slower-than-normal heartbeat. With bradycardia, the resting heart rate is less than 60 beats per minute. Treatment for this condition depends on the cause. Manage any health conditions that contribute to bradycardia as told by your health care provider. Do not use any products that contain nicotine or tobacco. These products include cigarettes, chewing tobacco, and vaping devices, such as e-cigarettes. Keep all follow-up visits. This is important. This information is not intended to replace advice given to you by your health care provider. Make sure you discuss any questions you have with your health care provider. Document Revised: 09/30/2020 Document Reviewed: 09/30/2020 Elsevier Patient Education  2024 [employer].

## 2023-12-10 NOTE — Progress Notes (Signed)
 Subjective:  Patient ID: Patrick Abbott, male    DOB: [birth year]  Age: 73 y.o. MRN: [id number]  CC: Hypertension   HPI Patrick Abbott presents for f/up ----  Discussed the use of AI scribe software for clinical note transcription with the patient, who gave verbal consent to proceed.  History of Present Illness   Patrick Abbott is a 73 year old male who presents for a routine check-up.  He feels generally well with no headache, blurred vision, chest pain, or shortness of breath during daily activities. He remains active, engaging in walking and yard work, and feels strong and good during these activities without experiencing chest pain, shortness of breath, dizziness, or lightheadedness unless he overexerts himself. When he overexerts, he feels tired and needs to rest, but does not experience cold sweats, only normal sweating from exertion.  A few months ago, he experienced difficulty urinating, which almost led him to seek emergency care. However, this issue has resolved, and he currently feels good. No abdominal pain or blood in urine.       Outpatient Medications Prior to Visit  Medication Sig Dispense Refill   amLODipine  (NORVASC ) 2.5 MG tablet Take 2.5 mg by mouth daily.     aspirin  EC 81 MG tablet Take 1 tablet (81 mg total) by mouth daily. 90 tablet 1   CALCIUM-VITAMIN D  PO Take 1 tablet by mouth daily.     dutasteride (AVODART) 0.5 MG capsule Take 0.5 mg by mouth daily.     Omega-3 Fatty Acids (FISH OIL) 1200 MG CAPS Take 1 capsule by mouth daily at 6 (six) AM.     pravastatin  (PRAVACHOL ) 40 MG tablet TAKE 1 TABLET BY MOUTH EVERY DAY 90 tablet 1   tamsulosin  (FLOMAX ) 0.4 MG CAPS capsule Take 1 capsule (0.4 mg total) by mouth daily. 90 capsule 1   metoprolol  tartrate (LOPRESSOR ) 50 MG tablet Take tablet (50mg ) by mouth TWO hours prior to your cardiac CT scan. 1 tablet 0   No facility-administered medications prior to visit.    ROS Review of Systems  Objective:  BP 132/78 (BP Location:  Left Arm, Patient Position: Sitting, Cuff Size: Normal)   Pulse (!) 58   Temp 97.7 F (36.5 C) (Oral)   Resp 16   Ht 5' 4 (1.626 m)   Wt 132 lb 9.6 oz (60.1 kg)   SpO2 99%   BMI 22.76 kg/m   BP Readings from Last 3 Encounters:  12/10/23 132/78  11/02/23 122/62  07/13/23 122/82    Wt Readings from Last 3 Encounters:  12/10/23 132 lb 9.6 oz (60.1 kg)  11/02/23 138 lb 6.4 oz (62.8 kg)  07/13/23 139 lb 3.2 oz (63.1 kg)    Physical Exam Vitals reviewed.  Constitutional:      Appearance: Normal appearance.   Cardiovascular:     Rate and Rhythm: Bradycardia present.     Comments: EKG--- SB with 2nd degree AV block (Mobitz 1) - new, 48 bpm RBBB No Q waves Abdominal:     Hernia: There is no hernia in the left inguinal area or right inguinal area.  Genitourinary:    Pubic Area: No rash.      Penis: Normal and circumcised.      Testes: Normal.     Epididymis:     Right: Normal.     Left: Normal.     Prostate: Enlarged. Not tender and no nodules present.     Rectum: Guaiac result negative. No mass, tenderness, anal fissure, external  hemorrhoid or internal hemorrhoid. Normal anal tone.   Musculoskeletal:     Right lower leg: No edema.     Left lower leg: No edema.  Lymphadenopathy:     Lower Body: No right inguinal adenopathy. No left inguinal adenopathy.   Neurological:     Mental Status: He is alert.     Lab Results  Component Value Date   WBC 5.1 12/10/2023   HGB 13.4 12/10/2023   HCT 40.4 12/10/2023   PLT 201.0 12/10/2023   GLUCOSE 92 12/10/2023   CHOL 150 12/10/2023   TRIG 118.0 12/10/2023   HDL 41.20 12/10/2023   LDLDIRECT 102.0 12/05/2021   LDLCALC 85 12/10/2023   ALT 20 12/09/2022   AST 19 12/09/2022   NA 139 12/10/2023   K 3.8 12/10/2023   CL 104 12/10/2023   CREATININE 1.03 12/10/2023   BUN 11 12/10/2023   CO2 30 12/10/2023   TSH 1.05 12/10/2023   PSA 0.89 12/10/2023   HGBA1C 5.8 (H) 06/09/2022    CT CORONARY MORPH W/CTA COR W/SCORE W/CA  W/CM &/OR WO/CM Addendum Date: 07/17/2023 ADDENDUM REPORT: 07/17/2023 14:15 EXAM: OVER-READ INTERPRETATION  CT CHEST The following report is an over-read performed by radiologist Dr. Nicolette Barrio [facility] on 07/17/2023. This over-read does not include interpretation of cardiac or coronary anatomy or pathology. The coronary CTA interpretation by the cardiologist is attached. COMPARISON:  None. FINDINGS: There are calcified right hilar lymph nodes. No enlarged lymph nodes are seen. Distal esophagus and upper abdomen are within normal limits as visualized. No acute lung infiltrate. The osseous structures are unremarkable. IMPRESSION: Calcified right hilar lymph nodes consistent with prior granulomatous disease. Electronically Signed   By: Tyron Gallon M.D.   On: 07/17/2023 14:15   Result Date: 07/17/2023 CLINICAL DATA:  Chest pain EXAM: Cardiac CTA MEDICATIONS: Sub lingual nitro. 4mg  and lopressor  50mg  TECHNIQUE: The patient was scanned on a Siemens Force 192 slice scanner. Gantry rotation speed was 250 msecs. Collimation was .6 mm. A 100 kV prospective scan was triggered in the ascending thoracic aorta at 140 HU's Full mA was used between 35% and 75% of the R-R interval. Average HR during the scan was 52 bpm. The 3D data set was interpreted on a dedicated work station using MPR, MIP and VRT modes. A total of 80 cc of contrast was used. FINDINGS: Non-cardiac: See separate report from [facility]. No significant findings on limited lung and soft tissue windows. Calcium Score: 3 vessel calcium noted LM 0 LAD 67.9 LCX 22.6 RCA 30.2 Total 121 Coronary Arteries: Right dominant with no anomalies LM: Normal LAD: 1-24% calcified plaque proximal, 50-69% mixed plaque in mid vessel 1-24% calcified plaque distally IM: Small vessel 25-49% mixed plaque D1: Large vessel 1-24% calcified ostial plaque Circumflex: 1-24% ostial calcified plaque OM1: Small vessel normal OM2: Normal RCA: 1-24% calcified plaque  in proximal and mid vessel PDA: Normal PLA: Normal IMPRESSION: 1. 3 vessel coronary calcium with score 121 which is 66 th percentile for age/sex 2.  Upper normal ascending thoracic aorta diameter 3.6 cm 3.  Small PFO present 4. CAD RADS 3 possibly obstructive disease in LAD study sent for FFR 5. Total plaque volume 127 mm3 of which 91% is non calcified and primarily in the LAD Janelle Mediate Electronically Signed: By: Janelle Mediate M.D. On: 07/09/2023 16:03   CT CORONARY FFR DATA PREP & FLUID ANALYSIS Result Date: 07/09/2023 CLINICAL DATA:  CAD EXAM: FFR CT TECHNIQUE: The best systolic and diastolic phases  of the patients gated cardiac CTA sent to [facility] for hemodynamic analysis . FINDINGS: RCA: FFR CT normal proximal 0.98 distal 0.95 LCX: FFR CT normal proximal 0.98 distal 0.95 LAD: FFR CT normal proximal 0.95, mid 0.90 and distal 0.88 IMPRESSION: Normal FFR CT indicating mixed plaque in LAD not hemodynamically significant Janelle Mediate Electronically Signed   By: Janelle Mediate M.D.   On: 07/09/2023 16:16    Assessment & Plan:   Essential hypertension- His BP is well controlled. EKG is negative for LVH. -     CBC with Differential/Platelet; Future -     TSH; Future -     Basic metabolic panel with GFR; Future -     EKG 12-Lead  Dyslipidemia, goal LDL below 130- LDL goal achieved. Doing well on the statin  -     Lipid panel; Future -     TSH; Future  Benign prostatic hyperplasia with nocturia -     Urinalysis, Routine w reflex microscopic; Future -     PSA; Future  Chronic sinus bradycardia -     TSH; Future -     EKG 12-Lead  Coronary artery disease involving native coronary artery of native heart without angina pectoris  Bradycardia with 41-50 beats per minute -     Ambulatory referral to Cardiology     Follow-up: Return in about 6 months (around 06/10/2024).  Sandra Crouch, MD

## 2024-02-12 ENCOUNTER — Other Ambulatory Visit: Payer: Self-pay | Admitting: Internal Medicine

## 2024-02-12 DIAGNOSIS — N401 Enlarged prostate with lower urinary tract symptoms: Secondary | ICD-10-CM

## 2024-03-17 DIAGNOSIS — H40033 Anatomical narrow angle, bilateral: Secondary | ICD-10-CM | POA: Diagnosis not present

## 2024-06-13 ENCOUNTER — Ambulatory Visit: Payer: Self-pay | Admitting: Internal Medicine

## 2024-06-13 ENCOUNTER — Encounter: Payer: Self-pay | Admitting: Internal Medicine

## 2024-06-13 ENCOUNTER — Ambulatory Visit: Admitting: Internal Medicine

## 2024-06-13 VITALS — BP 138/88 | HR 65 | Temp 97.6°F | Resp 16 | Ht 64.0 in | Wt 140.2 lb

## 2024-06-13 DIAGNOSIS — N401 Enlarged prostate with lower urinary tract symptoms: Secondary | ICD-10-CM | POA: Diagnosis not present

## 2024-06-13 DIAGNOSIS — R351 Nocturia: Secondary | ICD-10-CM | POA: Diagnosis not present

## 2024-06-13 DIAGNOSIS — E785 Hyperlipidemia, unspecified: Secondary | ICD-10-CM

## 2024-06-13 DIAGNOSIS — I10 Essential (primary) hypertension: Secondary | ICD-10-CM | POA: Diagnosis not present

## 2024-06-13 LAB — HEPATIC FUNCTION PANEL
ALT: 25 U/L (ref 3–53)
AST: 23 U/L (ref 5–37)
Albumin: 4.3 g/dL (ref 3.5–5.2)
Alkaline Phosphatase: 55 U/L (ref 39–117)
Bilirubin, Direct: 0 mg/dL — ABNORMAL LOW (ref 0.1–0.3)
Total Bilirubin: 0.6 mg/dL (ref 0.2–1.2)
Total Protein: 7.6 g/dL (ref 6.0–8.3)

## 2024-06-13 LAB — CBC WITH DIFFERENTIAL/PLATELET
Basophils Absolute: 0 K/uL (ref 0.0–0.1)
Basophils Relative: 0.5 % (ref 0.0–3.0)
Eosinophils Absolute: 0.1 K/uL (ref 0.0–0.7)
Eosinophils Relative: 1.3 % (ref 0.0–5.0)
HCT: 41 % (ref 39.0–52.0)
Hemoglobin: 13.7 g/dL (ref 13.0–17.0)
Lymphocytes Relative: 35.4 % (ref 12.0–46.0)
Lymphs Abs: 2.2 K/uL (ref 0.7–4.0)
MCHC: 33.5 g/dL (ref 30.0–36.0)
MCV: 80.4 fl (ref 78.0–100.0)
Monocytes Absolute: 0.4 K/uL (ref 0.1–1.0)
Monocytes Relative: 6 % (ref 3.0–12.0)
Neutro Abs: 3.6 K/uL (ref 1.4–7.7)
Neutrophils Relative %: 56.8 % (ref 43.0–77.0)
Platelets: 250 K/uL (ref 150.0–400.0)
RBC: 5.1 Mil/uL (ref 4.22–5.81)
RDW: 13.6 % (ref 11.5–15.5)
WBC: 6.3 K/uL (ref 4.0–10.5)

## 2024-06-13 LAB — BASIC METABOLIC PANEL WITH GFR
BUN: 11 mg/dL (ref 6–23)
CO2: 31 meq/L (ref 19–32)
Calcium: 9 mg/dL (ref 8.4–10.5)
Chloride: 102 meq/L (ref 96–112)
Creatinine, Ser: 0.99 mg/dL (ref 0.40–1.50)
GFR: 75.33 mL/min
Glucose, Bld: 93 mg/dL (ref 70–99)
Potassium: 3.9 meq/L (ref 3.5–5.1)
Sodium: 139 meq/L (ref 135–145)

## 2024-06-13 LAB — LIPID PANEL
Cholesterol: 206 mg/dL — ABNORMAL HIGH (ref 28–200)
HDL: 44.6 mg/dL
LDL Cholesterol: 109 mg/dL — ABNORMAL HIGH (ref 10–99)
NonHDL: 161.46
Total CHOL/HDL Ratio: 5
Triglycerides: 264 mg/dL — ABNORMAL HIGH (ref 10.0–149.0)
VLDL: 52.8 mg/dL — ABNORMAL HIGH (ref 0.0–40.0)

## 2024-06-13 MED ORDER — TAMSULOSIN HCL 0.4 MG PO CAPS: 0.4000 mg | ORAL_CAPSULE | Freq: Every day | ORAL | 0 refills | Status: DC

## 2024-06-13 MED ORDER — DUTASTERIDE 0.5 MG PO CAPS: 0.5000 mg | ORAL_CAPSULE | Freq: Every day | ORAL | 0 refills | Status: AC

## 2024-06-13 MED ORDER — PRAVASTATIN SODIUM 40 MG PO TABS: ORAL_TABLET | ORAL | 0 refills | Status: AC

## 2024-06-13 NOTE — Progress Notes (Unsigned)
 "  Subjective:  Patient ID: Patrick Abbott, male    DOB: 1951/04/27  Age: 73 y.o. MRN: [id number]  CC: Hypertension   HPI [name] presents for f/up ----  Discussed the use of AI scribe software for clinical note transcription with the patient, who gave verbal consent to proceed.  History of Present Illness Patrick Abbott is a 73 year old male who presents for a routine follow-up visit.  He feels good and maintains an active lifestyle by walking indoors on a treadmill and riding a bicycle. During these activities, he experiences no chest pain, shortness of breath, dizziness, or lightheadedness.  He received a flu shot earlier this year at a pharmacy. He is unsure about his COVID vaccination status.  No swelling in his legs or feet.     Outpatient Medications Prior to Visit  Medication Sig Dispense Refill   amLODipine  (NORVASC ) 2.5 MG tablet Take 2.5 mg by mouth daily.     aspirin  EC 81 MG tablet Take 1 tablet (81 mg total) by mouth daily. 90 tablet 1   CALCIUM-VITAMIN D  PO Take 1 tablet by mouth daily.     Omega-3 Fatty Acids (FISH OIL) 1200 MG CAPS Take 1 capsule by mouth daily at 6 (six) AM.     dutasteride  (AVODART ) 0.5 MG capsule TAKE 1 CAPSULE BY MOUTH EVERY DAY FOR 30 DAYS 90 capsule 1   pravastatin  (PRAVACHOL ) 40 MG tablet TAKE 1 TABLET BY MOUTH EVERY DAY 90 tablet 1   tamsulosin  (FLOMAX ) 0.4 MG CAPS capsule Take 1 capsule (0.4 mg total) by mouth daily. 90 capsule 1   No facility-administered medications prior to visit.    ROS Review of Systems  Constitutional:  Negative for appetite change, chills, diaphoresis and fatigue.  HENT: Negative.    Eyes: Negative.   Respiratory: Negative.  Negative for cough, chest tightness, shortness of breath and wheezing.   Cardiovascular:  Negative for chest pain, palpitations and leg swelling.  Gastrointestinal: Negative.  Negative for abdominal pain, constipation, diarrhea, nausea and vomiting.  Endocrine: Negative.   Genitourinary:  Negative.  Negative for difficulty urinating, dysuria and hematuria.  Musculoskeletal: Negative.  Negative for arthralgias and myalgias.  Skin: Negative.  Negative for color change and pallor.  Neurological:  Negative for dizziness, weakness and light-headedness.  Hematological:  Negative for adenopathy. Does not bruise/bleed easily.  Psychiatric/Behavioral: Negative.  The patient is not hyperactive.     Objective:  BP 138/88 (BP Location: Right Arm, Patient Position: Sitting, Cuff Size: Normal)   Pulse 65   Temp 97.6 F (36.4 C) (Oral)   Resp 16   Ht 5' 4 (1.626 m)   Wt 140 lb 3.2 oz (63.6 kg)   SpO2 97%   BMI 24.07 kg/m   BP Readings from Last 3 Encounters:  06/13/24 138/88  12/10/23 132/78  11/02/23 122/62    Wt Readings from Last 3 Encounters:  06/13/24 140 lb 3.2 oz (63.6 kg)  12/10/23 132 lb 9.6 oz (60.1 kg)  11/02/23 138 lb 6.4 oz (62.8 kg)    Physical Exam Vitals reviewed.  HENT:     Nose: Nose normal.     Mouth/Throat:     Mouth: Mucous membranes are moist.  Eyes:     General: No scleral icterus.    Conjunctiva/sclera: Conjunctivae normal.  Cardiovascular:     Rate and Rhythm: Normal rate and regular rhythm.     Heart sounds: No murmur heard.    No friction rub. No gallop.  Pulmonary:  Effort: Pulmonary effort is normal.     Breath sounds: No stridor. No wheezing, rhonchi or rales.  Abdominal:     General: Abdomen is flat.     Palpations: There is no mass.     Tenderness: There is no abdominal tenderness. There is no guarding or rebound.     Hernia: No hernia is present.  Musculoskeletal:        General: Normal range of motion.     Cervical back: Neck supple.     Right lower leg: No edema.     Left lower leg: No edema.  Lymphadenopathy:     Cervical: No cervical adenopathy.  Skin:    General: Skin is warm and dry.  Neurological:     General: No focal deficit present.     Mental Status: He is alert.  Psychiatric:        Mood and Affect:  Mood normal.        Behavior: Behavior normal.     Lab Results  Component Value Date   WBC 6.3 06/13/2024   HGB 13.7 06/13/2024   HCT 41.0 06/13/2024   PLT 250.0 06/13/2024   GLUCOSE 93 06/13/2024   CHOL 206 (H) 06/13/2024   TRIG 264.0 (H) 06/13/2024   HDL 44.60 06/13/2024   LDLDIRECT 102.0 12/05/2021   LDLCALC 109 (H) 06/13/2024   ALT 25 06/13/2024   AST 23 06/13/2024   NA 139 06/13/2024   K 3.9 06/13/2024   CL 102 06/13/2024   CREATININE 0.99 06/13/2024   BUN 11 06/13/2024   CO2 31 06/13/2024   TSH 1.05 12/10/2023   PSA 0.89 12/10/2023   HGBA1C 5.8 (H) 06/09/2022    CT CORONARY MORPH W/CTA COR W/SCORE W/CA W/CM &/OR WO/CM Addendum Date: 07/17/2023 ADDENDUM REPORT: 07/17/2023 14:15 EXAM: OVER-READ INTERPRETATION  CT CHEST The following report is an over-read performed by radiologist Dr. Greig Ferraris [facility] on 07/17/2023. This over-read does not include interpretation of cardiac or coronary anatomy or pathology. The coronary CTA interpretation by the cardiologist is attached. COMPARISON:  None. FINDINGS: There are calcified right hilar lymph nodes. No enlarged lymph nodes are seen. Distal esophagus and upper abdomen are within normal limits as visualized. No acute lung infiltrate. The osseous structures are unremarkable. IMPRESSION: Calcified right hilar lymph nodes consistent with prior granulomatous disease. Electronically Signed   By: Greig Pique M.D.   On: 07/17/2023 14:15   Result Date: 07/17/2023 CLINICAL DATA:  Chest pain EXAM: Cardiac CTA MEDICATIONS: Sub lingual nitro. 4mg  and lopressor  50mg  TECHNIQUE: The patient was scanned on a Siemens Force 192 slice scanner. Gantry rotation speed was 250 msecs. Collimation was .6 mm. A 100 kV prospective scan was triggered in the ascending thoracic aorta at 140 HU's Full mA was used between 35% and 75% of the R-R interval. Average HR during the scan was 52 bpm. The 3D data set was interpreted on a dedicated work  station using MPR, MIP and VRT modes. A total of 80 cc of contrast was used. FINDINGS: Non-cardiac: See separate report from [facility]. No significant findings on limited lung and soft tissue windows. Calcium Score: 3 vessel calcium noted LM 0 LAD 67.9 LCX 22.6 RCA 30.2 Total 121 Coronary Arteries: Right dominant with no anomalies LM: Normal LAD: 1-24% calcified plaque proximal, 50-69% mixed plaque in mid vessel 1-24% calcified plaque distally IM: Small vessel 25-49% mixed plaque D1: Large vessel 1-24% calcified ostial plaque Circumflex: 1-24% ostial calcified plaque OM1: Small vessel normal OM2:  Normal RCA: 1-24% calcified plaque in proximal and mid vessel PDA: Normal PLA: Normal IMPRESSION: 1. 3 vessel coronary calcium with score 121 which is 66 th percentile for age/sex 2.  Upper normal ascending thoracic aorta diameter 3.6 cm 3.  Small PFO present 4. CAD RADS 3 possibly obstructive disease in LAD study sent for FFR 5. Total plaque volume 127 mm3 of which 91% is non calcified and primarily in the LAD Maude Emmer Electronically Signed: By: Maude Emmer M.D. On: 07/09/2023 16:03   CT CORONARY FFR DATA PREP & FLUID ANALYSIS Result Date: 07/09/2023 CLINICAL DATA:  CAD EXAM: FFR CT TECHNIQUE: The best systolic and diastolic phases of the patients gated cardiac CTA sent to [facility] for hemodynamic analysis . FINDINGS: RCA: FFR CT normal proximal 0.98 distal 0.95 LCX: FFR CT normal proximal 0.98 distal 0.95 LAD: FFR CT normal proximal 0.95, mid 0.90 and distal 0.88 IMPRESSION: Normal FFR CT indicating mixed plaque in LAD not hemodynamically significant Maude Emmer Electronically Signed   By: Maude Emmer M.D.   On: 07/09/2023 16:16    Assessment & Plan:   Essential hypertension- BP is adequately well controlled. -     Basic metabolic panel with GFR; Future -     CBC with Differential/Platelet; Future -     Hepatic function panel; Future  Dyslipidemia, goal LDL below 130- LDL goal achieved.  Doing well on the statin  -     Lipid panel; Future -     Hepatic function panel; Future -     Pravastatin  Sodium; TAKE 1 TABLET BY MOUTH EVERY DAY  Dispense: 90 tablet; Refill: 0  Benign prostatic hyperplasia with nocturia -     Tamsulosin  HCl; Take 1 capsule (0.4 mg total) by mouth daily.  Dispense: 90 capsule; Refill: 0  Benign prostatic hyperplasia with lower urinary tract symptoms -     Dutasteride ; Take 1 capsule (0.5 mg total) by mouth daily.  Dispense: 90 capsule; Refill: 0     Follow-up: Return in about 3 months (around 09/11/2024).  Debby Molt, MD "

## 2024-06-13 NOTE — Patient Instructions (Signed)
 Hypertension, Adult High blood pressure (hypertension) is when the force of blood pumping through the arteries is too strong. The arteries are the blood vessels that carry blood from the heart throughout the body. Hypertension forces the heart to work harder to pump blood and may cause arteries to become narrow or stiff. Untreated or uncontrolled hypertension can lead to a heart attack, heart failure, a stroke, kidney disease, and other problems. A blood pressure reading consists of a higher number over a lower number. Ideally, your blood pressure should be below 120/80. The first ("top") number is called the systolic pressure. It is a measure of the pressure in your arteries as your heart beats. The second ("bottom") number is called the diastolic pressure. It is a measure of the pressure in your arteries as the heart relaxes. What are the causes? The exact cause of this condition is not known. There are some conditions that result in high blood pressure. What increases the risk? Certain factors may make you more likely to develop high blood pressure. Some of these risk factors are under your control, including: Smoking. Not getting enough exercise or physical activity. Being overweight. Having too much fat, sugar, calories, or salt (sodium) in your diet. Drinking too much alcohol. Other risk factors include: Having a personal history of heart disease, diabetes, high cholesterol, or kidney disease. Stress. Having a family history of high blood pressure and high cholesterol. Having obstructive sleep apnea. Age. The risk increases with age. What are the signs or symptoms? High blood pressure may not cause symptoms. Very high blood pressure (hypertensive crisis) may cause: Headache. Fast or irregular heartbeats (palpitations). Shortness of breath. Nosebleed. Nausea and vomiting. Vision changes. Severe chest pain, dizziness, and seizures. How is this diagnosed? This condition is diagnosed by  measuring your blood pressure while you are seated, with your arm resting on a flat surface, your legs uncrossed, and your feet flat on the floor. The cuff of the blood pressure monitor will be placed directly against the skin of your upper arm at the level of your heart. Blood pressure should be measured at least twice using the same arm. Certain conditions can cause a difference in blood pressure between your right and left arms. If you have a high blood pressure reading during one visit or you have normal blood pressure with other risk factors, you may be asked to: Return on a different day to have your blood pressure checked again. Monitor your blood pressure at home for 1 week or longer. If you are diagnosed with hypertension, you may have other blood or imaging tests to help your health care provider understand your overall risk for other conditions. How is this treated? This condition is treated by making healthy lifestyle changes, such as eating healthy foods, exercising more, and reducing your alcohol intake. You may be referred for counseling on a healthy diet and physical activity. Your health care provider may prescribe medicine if lifestyle changes are not enough to get your blood pressure under control and if: Your systolic blood pressure is above 130. Your diastolic blood pressure is above 80. Your personal target blood pressure may vary depending on your medical conditions, your age, and other factors. Follow these instructions at home: Eating and drinking  Eat a diet that is high in fiber and potassium, and low in sodium, added sugar, and fat. An example of this eating plan is called the DASH diet. DASH stands for Dietary Approaches to Stop Hypertension. To eat this way: Eat  plenty of fresh fruits and vegetables. Try to fill one half of your plate at each meal with fruits and vegetables. Eat whole grains, such as whole-wheat pasta, brown rice, or whole-grain bread. Fill about one  fourth of your plate with whole grains. Eat or drink low-fat dairy products, such as skim milk or low-fat yogurt. Avoid fatty cuts of meat, processed or cured meats, and poultry with skin. Fill about one fourth of your plate with lean proteins, such as fish, chicken without skin, beans, eggs, or tofu. Avoid pre-made and processed foods. These tend to be higher in sodium, added sugar, and fat. Reduce your daily sodium intake. Many people with hypertension should eat less than 1,500 mg of sodium a day. Do not drink alcohol if: Your health care provider tells you not to drink. You are pregnant, may be pregnant, or are planning to become pregnant. If you drink alcohol: Limit how much you have to: 0-1 drink a day for women. 0-2 drinks a day for men. Know how much alcohol is in your drink. In the [place] one drink equals one 12 oz bottle of beer (355 mL), one 5 oz glass of wine (148 mL), or one 1 oz glass of hard liquor (44 mL). Lifestyle  Work with your health care provider to maintain a healthy body weight or to lose weight. Ask what an ideal weight is for you. Get at least 30 minutes of exercise that causes your heart to beat faster (aerobic exercise) most days of the week. Activities may include walking, swimming, or biking. Include exercise to strengthen your muscles (resistance exercise), such as Pilates or lifting weights, as part of your weekly exercise routine. Try to do these types of exercises for 30 minutes at least 3 days a week. Do not use any products that contain nicotine or tobacco. These products include cigarettes, chewing tobacco, and vaping devices, such as e-cigarettes. If you need help quitting, ask your health care provider. Monitor your blood pressure at home as told by your health care provider. Keep all follow-up visits. This is important. Medicines Take over-the-counter and prescription medicines only as told by your health care provider. Follow directions carefully. Blood  pressure medicines must be taken as prescribed. Do not skip doses of blood pressure medicine. Doing this puts you at risk for problems and can make the medicine less effective. Ask your health care provider about side effects or reactions to medicines that you should watch for. Contact a health care provider if you: Think you are having a reaction to a medicine you are taking. Have headaches that keep coming back (recurring). Feel dizzy. Have swelling in your ankles. Have trouble with your vision. Get help right away if you: Develop a severe headache or confusion. Have unusual weakness or numbness. Feel faint. Have severe pain in your chest or abdomen. Vomit repeatedly. Have trouble breathing. These symptoms may be an emergency. Get help right away. Call 911. Do not wait to see if the symptoms will go away. Do not drive yourself to the hospital. Summary Hypertension is when the force of blood pumping through your arteries is too strong. If this condition is not controlled, it may put you at risk for serious complications. Your personal target blood pressure may vary depending on your medical conditions, your age, and other factors. For most people, a normal blood pressure is less than 120/80. Hypertension is treated with lifestyle changes, medicines, or a combination of both. Lifestyle changes include losing weight, eating a healthy,  low-sodium diet, exercising more, and limiting alcohol. This information is not intended to replace advice given to you by your health care provider. Make sure you discuss any questions you have with your health care provider. Document Revised: 04/16/2021 Document Reviewed: 04/16/2021 [employer] Patient Education  2024 [employer].

## 2024-06-30 ENCOUNTER — Other Ambulatory Visit: Payer: Self-pay | Admitting: Internal Medicine

## 2024-06-30 DIAGNOSIS — N401 Enlarged prostate with lower urinary tract symptoms: Secondary | ICD-10-CM

## 2024-09-12 ENCOUNTER — Ambulatory Visit: Admitting: Internal Medicine

## 2024-11-02 ENCOUNTER — Ambulatory Visit
